# Patient Record
Sex: Female | Born: 1990 | Race: White | Hispanic: No | Marital: Married | Smoking: Former smoker
Health system: Southern US, Community
[De-identification: ages and names within clinical notes are randomized; demographics above are authoritative.]

## PROBLEM LIST (undated history)

## (undated) DIAGNOSIS — F32A Depression, unspecified: Secondary | ICD-10-CM

## (undated) DIAGNOSIS — J45909 Unspecified asthma, uncomplicated: Secondary | ICD-10-CM

## (undated) DIAGNOSIS — F909 Attention-deficit hyperactivity disorder, unspecified type: Secondary | ICD-10-CM

## (undated) DIAGNOSIS — F419 Anxiety disorder, unspecified: Secondary | ICD-10-CM

## (undated) DIAGNOSIS — I10 Essential (primary) hypertension: Secondary | ICD-10-CM

## (undated) HISTORY — DX: Depression, unspecified: F32.A

## (undated) HISTORY — PX: ELBOW FRACTURE SURGERY: SHX616

## (undated) HISTORY — DX: Essential (primary) hypertension: I10

## (undated) HISTORY — DX: Attention-deficit hyperactivity disorder, unspecified type: F90.9

## (undated) HISTORY — DX: Unspecified asthma, uncomplicated: J45.909

## (undated) HISTORY — DX: Anxiety disorder, unspecified: F41.9

## (undated) HISTORY — PX: TOOTH EXTRACTION: SUR596

---
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
## 2008-02-01 ENCOUNTER — Other Ambulatory Visit: Admission: RE | Admit: 2008-02-01 | Discharge: 2008-02-01 | Payer: Self-pay | Admitting: Internal Medicine

## 2018-08-22 DIAGNOSIS — J4520 Mild intermittent asthma, uncomplicated: Secondary | ICD-10-CM | POA: Insufficient documentation

## 2018-11-11 DIAGNOSIS — F4010 Social phobia, unspecified: Secondary | ICD-10-CM | POA: Insufficient documentation

## 2018-11-11 DIAGNOSIS — F900 Attention-deficit hyperactivity disorder, predominantly inattentive type: Secondary | ICD-10-CM | POA: Insufficient documentation

## 2018-11-11 DIAGNOSIS — F330 Major depressive disorder, recurrent, mild: Secondary | ICD-10-CM | POA: Insufficient documentation

## 2018-11-11 HISTORY — DX: Social phobia, unspecified: F40.10

## 2018-11-11 HISTORY — DX: Major depressive disorder, recurrent, mild: F33.0

## 2020-06-29 NOTE — L&D Delivery Note (Addendum)
OB/GYN Faculty Practice Delivery Note  Kathy Davenport is a 30 y.o. G1P0 at [redacted]w[redacted]d admitted for IOL for gHTN.   GBS Status: Negative/-- (08/08 0341) Maximum Maternal Temperature: 98.2  Labor course: Initial SVE: 1.5/50/posterior. Augmentation with Pitocin and AROM. She then progressed to complete.  ROM: 9h 58m with clear fluid  Birth: At 2149 a viable female was delivered via spontaneous vaginal delivery (Presentation: LOA). Nuchal cord present: Yes.  Shoulders and body delivered in usual fashion. Infant placed directly on mom's abdomen for bonding/skin-to-skin, baby dried and stimulated. Cord clamped x 2 after 1 minute and cut by FOB, Sharlet [place]ina.  Cord blood collected.  The placenta separated spontaneously and delivered via gentle cord traction.  Pitocin infused rapidly IV per protocol.  Brisk bleeding noted after delivery of placenta. Fundus firm with massage. Bleeding slowed down immediately. Placenta inspected and appears to be intact with a 3 VC.  Placenta/Cord with the following complications: none.  Cord pH: n/a Sponge and instrument count were correct x2.  Intrapartum complications:  gHTN Anesthesia:  epidural Episiotomy: none Lacerations:  1st degree and vaginal Suture Repair: 2.0 vicryl EBL (mL): 400>>additional 70 mL during first PP fundal rub   Infant: APGAR (1 MIN):  8 APGAR (10 MINS): 9 Infant weight: 7 lbs 5.3 oz (3325 g)  Mom to postpartum.  Baby to Couplet care / Skin to Skin. Placenta to L&D   Plans to Breast and bottlefeed Contraception: oral progesterone-only contraceptive Circumcision: wants inpatient  Note sent to [facility]: [facility] for pp visit.  Raelyn Mora , MSN, CNM 02/14/2021 10:12 PM

## 2020-07-10 ENCOUNTER — Ambulatory Visit (INDEPENDENT_AMBULATORY_CARE_PROVIDER_SITE_OTHER): Payer: Self-pay

## 2020-07-10 ENCOUNTER — Other Ambulatory Visit: Payer: Self-pay

## 2020-07-10 VITALS — Ht 65.0 in

## 2020-07-10 DIAGNOSIS — Z3400 Encounter for supervision of normal first pregnancy, unspecified trimester: Secondary | ICD-10-CM | POA: Insufficient documentation

## 2020-07-10 DIAGNOSIS — Z3200 Encounter for pregnancy test, result unknown: Secondary | ICD-10-CM

## 2020-07-10 LAB — POCT URINE PREGNANCY: Preg Test, Ur: POSITIVE — AB

## 2020-07-10 MED ORDER — BLOOD PRESSURE KIT DEVI: 1.0000 | 0 refills | Status: DC | PRN

## 2020-07-10 NOTE — Progress Notes (Signed)
Patient was assessed and managed by nursing staff during this encounter. I have reviewed the chart and agree with the documentation and plan. I have also made any necessary editorial changes.  Warden Fillers, MD 07/10/2020 12:24 PM

## 2020-07-10 NOTE — Progress Notes (Addendum)
..    In-Person  Location:[facility] Patient: Kathy Davenport Provider: Nurse   I discussed the limitations, risks, security and privacy concerns of performing an evaluation and management service by telephone and the availability of in person appointments. I also discussed with the patient that there may be a patient responsible charge related to this service. The patient expressed understanding and agreed to proceed.   History of Present Illness: PRENATAL INTAKE SUMMARY  Kathy Davenport presents today New OB Nurse Interview.  OB History    Gravida  1   Para      Term      Preterm      AB      Living        SAB      IAB      Ectopic      Multiple      Live Births             I have reviewed the patient's medical, obstetrical, social, and family histories, medications, and available lab results.  SUBJECTIVE Patient reports hx of anxiety and depression, currenty seeing a therapist. Denies suicidal ideations Pt and FOB report being happy about the pregnancy   Observations/Objective: Initial nurse interview for history/labs (New OB) Pt is in the office with FOB  EDD: 02-21-2021 GA: [redacted]w[redacted]d GP:G1P0  GENERAL APPEARANCE: alert, well appearing  Assessment and Plan: Normal pregnancy BP [facility] Babyscripts sent to email Labs to be completed at next visit with Dr. Earlene Plater on 08-07-2020. PhQ-9= 16   Follow Up Instructions:   I discussed the assessment and treatment plan with the patient. The patient was provided an opportunity to ask questions and all were answered. The patient agreed with the plan and demonstrated an understanding of the instructions.   The patient was advised to call back or seek an in-person evaluation if the symptoms worsen or if the condition fails to improve as anticipated.  I provided 15 minutes of face-to-face time during this encounter.   Katrina Stack, RN

## 2020-07-26 ENCOUNTER — Other Ambulatory Visit: Payer: Self-pay

## 2020-07-26 ENCOUNTER — Ambulatory Visit (INDEPENDENT_AMBULATORY_CARE_PROVIDER_SITE_OTHER): Payer: 59

## 2020-07-26 DIAGNOSIS — O3680X0 Pregnancy with inconclusive fetal viability, not applicable or unspecified: Secondary | ICD-10-CM

## 2020-07-26 DIAGNOSIS — Z789 Other specified health status: Secondary | ICD-10-CM

## 2020-07-26 NOTE — Progress Notes (Signed)
DATING AND VIABILITY SONOGRAM   Kathy Davenport is a 30 y.o. year old G1P0 with LMP Patient's last menstrual period was 05/17/2020. which would correlate to  [redacted]w[redacted]d weeks gestation.  She has regular menstrual cycles.   She is here today for a confirmatory initial sonogram.    GESTATION: SINGLETON pregnancy     FETAL ACTIVITY:          Heart rate         171          The fetus is active.  Gestational criteria: Estimated Date of Delivery: 02/21/21 by LMP now at [redacted]w[redacted]d  Previous Scans:0                     CROWN RUMP LENGTH                   10 weeks                                                                               AVERAGE EGA(BY THIS SCAN):  10 weeks  WORKING EDD( LMP ):  02/23/21     TECHNICIAN COMMENTS:  Single live IUP at [redacted]w[redacted]d by CRL. FHR 171   A copy of this report including all images has been saved and backed up to a second source for retrieval if needed. All measures and details of the anatomical scan, placentation, fluid volume and pelvic anatomy are contained in that report.  Celestia Khat Leonell Lobdell 07/26/2020 8:18 AM

## 2020-07-26 NOTE — Progress Notes (Signed)
Patient was assessed and managed by nursing staff during this encounter. I have reviewed the chart and agree with the documentation and plan. I have also made any necessary editorial changes.  Warden Fillers, MD 07/26/2020 10:31 AM

## 2020-08-07 ENCOUNTER — Ambulatory Visit (INDEPENDENT_AMBULATORY_CARE_PROVIDER_SITE_OTHER): Payer: 59 | Admitting: Obstetrics and Gynecology

## 2020-08-07 ENCOUNTER — Other Ambulatory Visit: Payer: Self-pay

## 2020-08-07 ENCOUNTER — Encounter: Payer: Self-pay | Admitting: Obstetrics and Gynecology

## 2020-08-07 ENCOUNTER — Other Ambulatory Visit (HOSPITAL_COMMUNITY)
Admission: RE | Admit: 2020-08-07 | Discharge: 2020-08-07 | Disposition: A | Discharge status: Home / Self Care | Payer: 59 | Source: Ambulatory Visit | Attending: Obstetrics and Gynecology | Admitting: Obstetrics and Gynecology

## 2020-08-07 VITALS — BP 117/78 | HR 87 | Wt 192.8 lb

## 2020-08-07 DIAGNOSIS — Z3400 Encounter for supervision of normal first pregnancy, unspecified trimester: Secondary | ICD-10-CM | POA: Diagnosis present

## 2020-08-07 DIAGNOSIS — Z3A11 11 weeks gestation of pregnancy: Secondary | ICD-10-CM

## 2020-08-07 DIAGNOSIS — Z6830 Body mass index (BMI) 30.0-30.9, adult: Secondary | ICD-10-CM

## 2020-08-07 DIAGNOSIS — Z23 Encounter for immunization: Secondary | ICD-10-CM

## 2020-08-07 DIAGNOSIS — F419 Anxiety disorder, unspecified: Secondary | ICD-10-CM

## 2020-08-07 DIAGNOSIS — F32A Depression, unspecified: Secondary | ICD-10-CM | POA: Insufficient documentation

## 2020-08-07 DIAGNOSIS — F339 Major depressive disorder, recurrent, unspecified: Secondary | ICD-10-CM

## 2020-08-07 DIAGNOSIS — F909 Attention-deficit hyperactivity disorder, unspecified type: Secondary | ICD-10-CM

## 2020-08-07 HISTORY — DX: Depression, unspecified: F32.A

## 2020-08-07 MED ORDER — ASPIRIN EC 81 MG PO TBEC: 81.0000 mg | DELAYED_RELEASE_TABLET | Freq: Every day | ORAL | 2 refills | Status: DC

## 2020-08-07 NOTE — Progress Notes (Signed)
INITIAL PRENATAL VISIT NOTE  Subjective:  Kathy Davenport is a 30 y.o. G1P0 at 5w5dby LMP c/w 10 weeks [place] seen today for her initial prenatal visit. This is an unplanned pregnancy. She and partner are happy with the pregnancy. She was using nothing for birth control previously. She has an obstetric history significant for n/a. She has a medical history significant for asthma, depression/anxiety.  Was on adderrall until she found out she was pregnant, then stopped adderall. Tapered ativan when she found out she was pregnant and no longer on it as of 2 days ago. Still on lexapro and doing well with it.   Patient reports fatigue and nausea.  Contractions: Not present. Vag. Bleeding: None.   . Denies leaking of fluid.   Past Medical History:  Diagnosis Date  . ADHD (attention deficit hyperactivity disorder)   . Anxiety   . Asthma   . Depression    Past Surgical History:  Procedure Laterality Date  . ELBOW FRACTURE SURGERY     OB History  Gravida Para Term Preterm AB Living  1            SAB IAB Ectopic Multiple Live Births               # Outcome Date GA Lbr Len/2nd Weight Sex Delivery Anes PTL Lv  1 Current            Social History   Socioeconomic History  . Marital status: Single    Spouse name: Not on file  . Number of children: Not on file  . Years of education: Not on file  . Highest education level: Not on file  Occupational History  . Not on file  Tobacco Use  . Smoking status: Current Every Day Smoker    Types: E-cigarettes  . Smokeless tobacco: Never Used  . Tobacco comment: vape  Vaping Use  . Vaping Use: Every day  Substance and Sexual Activity  . Alcohol use: Not Currently  . Drug use: Never  . Sexual activity: Yes  Other Topics Concern  . Not on file  Social History Narrative  . Not on file   Social Determinants of Health   Financial Resource Strain: Not on file  Food Insecurity: Not on file  Transportation Needs: Not on file  Physical  Activity: Not on file  Stress: Not on file  Social Connections: Not on file    Family History  Problem Relation Age of Onset  . Hypertension Mother      Current Outpatient Medications:  .  albuterol (PROVENTIL) (2.5 MG/3ML) 0.083% nebulizer solution, Take 2.5 mg by nebulization every 6 (six) hours as needed for wheezing or shortness of breath., Disp: , Rfl:  .  aspirin EC 81 MG tablet, Take 1 tablet (81 mg total) by mouth daily. Take after 12 weeks for prevention of preeclampsia later in pregnancy, Disp: 300 tablet, Rfl: 2 .  escitalopram (LEXAPRO) 20 MG tablet, Take 20 mg by mouth daily., Disp: , Rfl:  .  Blood Pressure Monitoring (BLOOD PRESSURE KIT) DEVI, 1 kit by Does not apply route as needed. Large (Patient not taking: Reported on 08/07/2020), Disp: 1 each, Rfl: 0  Allergies  Allergen Reactions  . Penicillins Hives   Review of Systems: Negative except for what is mentioned in HPI.  Objective:   Vitals:   08/07/20 0840  BP: 117/78  Pulse: 87  Weight: 192 lb 12.8 oz (87.5 kg)    Fetal Status: Fetal Heart  Rate (bpm): 164         Physical Exam: BP 117/78   Pulse 87   Wt 192 lb 12.8 oz (87.5 kg)   LMP 05/17/2020   BMI 32.08 kg/m  CONSTITUTIONAL: Well-developed, well-nourished female in no acute distress.  NEUROLOGIC: Alert and oriented to person, place, and time. Normal reflexes, muscle tone coordination. No cranial nerve deficit noted. PSYCHIATRIC: Normal mood and affect. Normal behavior. Normal judgment and thought content. SKIN: Skin is warm and dry. No rash noted. Not diaphoretic. No erythema. No pallor. HENT:  Normocephalic, atraumatic, External right and left ear normal. Oropharynx is clear and moist EYES: Conjunctivae and EOM are normal. Pupils are equal, round, and reactive to light. No scleral icterus.  NECK: Normal range of motion, supple, no masses CARDIOVASCULAR: Normal heart rate noted, regular rhythm RESPIRATORY: Effort and breath sounds normal, no  problems with respiration noted BREASTS: symmetric, non-tender, no masses palpable ABDOMEN: Soft, nontender, nondistended, gravid. GU: normal appearing external female genitalia, nulliparous normal appearing cervix, scant white discharge in vagina, no lesions noted Bimanual: 10 weeks sized uterus, no adnexal tenderness or palpable lesions noted MUSCULOSKELETAL: Normal range of motion. EXT:  No edema and no tenderness. 2+ distal pulses.   Assessment and Plan:  Pregnancy: G1P0 at 65w5dby LMP  1. [redacted] weeks gestation of pregnancy  2. Supervision of normal first pregnancy, antepartum - [place] [employer], multiple providers, fellows, medical students, virtual visits, MyChart.  - to get genetic counseling - Enroll Patient in Babyscripts - Babyscripts Schedule Optimization - CBC/D/Plt+RPR+Rh+ABO+Rub Ab... - Cytology - PAP( [place]) - Cervicovaginal ancillary only( [place]) - Culture, OB Urine - [place]FM OB DETAIL +14 WK; Future - Genetic Screening - HgB A1c - UTD on COVID vaccine, recommend booster  3. BMI 30.0-30.9,adult Start baby ASA 12 weeks - TSH   4. Recurrent major depressive disorder, remission status unspecified (HFertile Doing well on lexapro for now Cont lexapro, reassess as needed  5. Attention deficit hyperactivity disorder (ADHD), unspecified ADHD type Stopped adderall when found out she was pregnant  6. Anxiety Tapered off ativan when found out she was pregnant    Preterm labor symptoms and general obstetric precautions including but not limited to vaginal bleeding, contractions, leaking of fluid and fetal movement were reviewed in detail with the patient.  Please refer to After Visit Summary for other counseling recommendations.   Return in about 4 weeks (around 09/04/2020) for low OB, in person.  KSloan Leiter2/02/2021 10:09 AM

## 2020-08-07 NOTE — Progress Notes (Signed)
NOB in office, intake completed on 01/12//22 and u/s on 07/26/20. Pt is with FOB today, reports no complaints.

## 2020-08-08 LAB — CBC/D/PLT+RPR+RH+ABO+RUB AB...
Antibody Screen: NEGATIVE
Basophils Absolute: 0 10*3/uL (ref 0.0–0.2)
Basos: 1 %
EOS (ABSOLUTE): 0.5 10*3/uL — ABNORMAL HIGH (ref 0.0–0.4)
Eos: 6 %
HCV Ab: <0.1 s/co ratio (ref 0.0–0.9)
HIV Screen 4th Generation wRfx: NONREACTIVE
Hematocrit: 38.8 % (ref 34.0–46.6)
Hemoglobin: 13 g/dL (ref 11.1–15.9)
Hepatitis B Surface Ag: NEGATIVE
Immature Grans (Abs): 0 10*3/uL (ref 0.0–0.1)
Immature Granulocytes: 0 %
Lymphocytes Absolute: 2.5 10*3/uL (ref 0.7–3.1)
Lymphs: 30 %
MCH: 32.7 pg (ref 26.6–33.0)
MCHC: 33.5 g/dL (ref 31.5–35.7)
MCV: 98 fL — ABNORMAL HIGH (ref 79–97)
Monocytes Absolute: 0.4 10*3/uL (ref 0.1–0.9)
Monocytes: 5 %
Neutrophils Absolute: 5.1 10*3/uL (ref 1.4–7.0)
Neutrophils: 58 %
Platelets: 271 10*3/uL (ref 150–450)
RBC: 3.97 x10E6/uL (ref 3.77–5.28)
RDW: 12.4 % (ref 11.7–15.4)
RPR Ser Ql: NONREACTIVE
Rh Factor: POSITIVE
Rubella Antibodies, IGG: 3.71 index (ref >0.99)
WBC: 8.6 10*3/uL (ref 3.4–10.8)

## 2020-08-08 LAB — CERVICOVAGINAL ANCILLARY ONLY
Bacterial Vaginitis (gardnerella): NEGATIVE
Candida Glabrata: NEGATIVE
Candida Vaginitis: NEGATIVE
Chlamydia: NEGATIVE
Comment: NEGATIVE
Comment: NEGATIVE
Comment: NEGATIVE
Comment: NEGATIVE
Comment: NEGATIVE
Comment: NORMAL
Neisseria Gonorrhea: NEGATIVE
Trichomonas: NEGATIVE

## 2020-08-08 LAB — HEMOGLOBIN A1C
Est. average glucose Bld gHb Est-mCnc: 111 mg/dL
Hgb A1c MFr Bld: 5.5 % (ref 4.8–5.6)

## 2020-08-08 LAB — TSH: TSH: 2.53 u[IU]/mL (ref 0.450–4.500)

## 2020-08-08 LAB — HCV INTERPRETATION

## 2020-08-09 LAB — CULTURE, OB URINE

## 2020-08-09 LAB — URINE CULTURE, OB REFLEX

## 2020-08-13 LAB — CYTOLOGY - PAP
Comment: NEGATIVE
Diagnosis: NEGATIVE
Diagnosis: REACTIVE
High risk HPV: NEGATIVE

## 2020-08-15 ENCOUNTER — Encounter: Payer: Self-pay | Admitting: Obstetrics and Gynecology

## 2020-08-20 ENCOUNTER — Encounter: Payer: Self-pay | Admitting: Obstetrics and Gynecology

## 2020-08-27 DIAGNOSIS — Z419 Encounter for procedure for purposes other than remedying health state, unspecified: Secondary | ICD-10-CM | POA: Diagnosis not present

## 2020-09-04 ENCOUNTER — Encounter: Payer: Self-pay | Admitting: Obstetrics and Gynecology

## 2020-09-04 ENCOUNTER — Ambulatory Visit (INDEPENDENT_AMBULATORY_CARE_PROVIDER_SITE_OTHER): Payer: 59 | Admitting: Obstetrics and Gynecology

## 2020-09-04 ENCOUNTER — Other Ambulatory Visit: Payer: Self-pay

## 2020-09-04 VITALS — BP 129/82 | HR 87 | Wt 193.2 lb

## 2020-09-04 DIAGNOSIS — F339 Major depressive disorder, recurrent, unspecified: Secondary | ICD-10-CM

## 2020-09-04 DIAGNOSIS — F419 Anxiety disorder, unspecified: Secondary | ICD-10-CM

## 2020-09-04 DIAGNOSIS — Z3400 Encounter for supervision of normal first pregnancy, unspecified trimester: Secondary | ICD-10-CM

## 2020-09-04 DIAGNOSIS — Z6830 Body mass index (BMI) 30.0-30.9, adult: Secondary | ICD-10-CM

## 2020-09-04 DIAGNOSIS — Z3A15 15 weeks gestation of pregnancy: Secondary | ICD-10-CM

## 2020-09-04 DIAGNOSIS — O99612 Diseases of the digestive system complicating pregnancy, second trimester: Secondary | ICD-10-CM

## 2020-09-04 DIAGNOSIS — K5900 Constipation, unspecified: Secondary | ICD-10-CM

## 2020-09-04 NOTE — Patient Instructions (Addendum)
Wonderful to meet you today! -You may take melatonin 3mg  nightly prior to bedtime as needed for insomnia. -You may take miralax 1 capful daily as needed for constipation. Be sure to drink plenty of water and eat lots of fiber to prevent constipation. -We will see you in 4 weeks!  .html">  First Trimester of Pregnancy  The first trimester of pregnancy starts on the first day of your last menstrual period until the end of week 12. This is also called months 1 through 3 of pregnancy. Body changes during your first trimester Your body goes through many changes during pregnancy. The changes usually return to normal after your baby is born. Physical changes  You may gain or lose weight.  Your breasts may grow larger and hurt. The area around your nipples may get darker.  Dark spots or blotches may develop on your face.  You may have changes in your hair. Health changes  You may feel like you might vomit (nauseous), and you may vomit.  You may have heartburn.  You may have headaches.  You may have trouble pooping (constipation).  Your gums may bleed. Other changes  You may get tired easily.  You may pee (urinate) more often.  Your menstrual periods will stop.  You may not feel hungry.  You may want to eat certain kinds of food.  You may have changes in your emotions from day to day.  You may have more dreams. Follow these instructions at home: Medicines  Take over-the-counter and prescription medicines only as told by your doctor. Some medicines are not safe during pregnancy.  Take a prenatal vitamin that contains at least 600 micrograms (mcg) of folic acid. Eating and drinking  Eat healthy meals that include: ? Fresh fruits and vegetables. ? Whole grains. ? Good sources of protein, such as meat, eggs, or tofu. ? Low-fat dairy products.  Avoid raw meat and unpasteurized juice, milk, and cheese.  If you feel like you  may vomit, or you vomit: ? Eat 4 or 5 small meals a day instead of 3 large meals. ? Try eating a few soda crackers. ? Drink liquids between meals instead of during meals.  You may need to take these actions to prevent or treat trouble pooping: ? Drink enough fluids to keep your pee (urine) pale yellow. ? Eat foods that are high in fiber. These include beans, whole grains, and fresh fruits and vegetables. ? Limit foods that are high in fat and sugar. These include fried or sweet foods. Activity  Exercise only as told by your doctor. Most people can do their usual exercise routine during pregnancy.  Stop exercising if you have cramps or pain in your lower belly (abdomen) or low back.  Do not exercise if it is too hot or too humid, or if you are in a place of great height (high altitude).  Avoid heavy lifting.  If you choose to, you may have sex unless your doctor tells you not to. Relieving pain and discomfort  Wear a good support bra if your breasts are sore.  Rest with your legs raised (elevated) if you have leg cramps or low back pain.  If you have bulging veins (varicose veins) in your legs: ? Wear support hose as told by your doctor. ? Raise your feet for 15 minutes, 3-4 times a day. ? Limit salt in your food. Safety  Wear your seat belt at all times when you are in a car.  Talk with  your doctor if someone is hurting you or yelling at you.  Talk with your doctor if you are feeling sad or have thoughts of hurting yourself. Lifestyle  Do not use hot tubs, steam rooms, or saunas.  Do not douche. Do not use tampons or scented sanitary pads.  Do not use herbal medicines, illegal drugs, or medicines that are not approved by your doctor. Do not drink alcohol.  Do not smoke or use any products that contain nicotine or tobacco. If you need help quitting, ask your doctor.  Avoid cat litter boxes and soil that is used by cats. These carry germs that can cause harm to the baby  and can cause a loss of your baby by miscarriage or stillbirth. General instructions  Keep all follow-up visits. This is important.  Ask for help if you need counseling or if you need help with nutrition. Your doctor can give you advice or tell you where to go for help.  Visit your dentist. At home, brush your teeth with a soft toothbrush. Floss gently.  Write down your questions. Take them to your prenatal visits. Where to find more information  American Pregnancy Association: americanpregnancy.org  [employer]st[employer]ians [employer]necologists: www.acog.org  Office on Women's Health: MightyReward.co.nz Contact a doctor if:  You are dizzy.  You have a fever.  You have mild cramps or pressure in your lower belly.  You have a nagging pain in your belly area.  You continue to feel like you may vomit, you vomit, or you have watery poop (diarrhea) for 24 hours or longer.  You have a bad-smelling fluid coming from your vagina.  You have pain when you pee.  You are exposed to a disease that spreads from person to person, such as chickenpox, measles, Zika virus, HIV, or hepatitis. Get help right away if:  You have spotting or bleeding from your vagina.  You have very bad belly cramping or pain.  You have shortness of breath or chest pain.  You have any kind of injury, such as from a fall or a car crash.  You have new or increased pain, swelling, or redness in an arm or leg. Summary  The first trimester of pregnancy starts on the first day of your last menstrual period until the end of week 12 (months 1 through 3).  Eat 4 or 5 small meals a day instead of 3 large meals.  Do not smoke or use any products that contain nicotine or tobacco. If you need help quitting, ask your doctor.  Keep all follow-up visits. This information is not intended to replace advice given to you by your health care provider. Make sure you discuss any questions you have with your  health care provider. Document Revised: 11/22/2019 Document Reviewed: 09/28/2019 Elsevier Patient Education  2021 [employer].

## 2020-09-04 NOTE — Progress Notes (Signed)
   PRENATAL VISIT NOTE  Subjective:  Kathy Davenport is a 30 y.o. G1P0 at [redacted]w[redacted]d being seen today for ongoing prenatal care.  She is currently monitored for the following issues for this low-risk pregnancy and has Supervision of normal first pregnancy, antepartum; BMI 30.0-30.9,adult; Depression; ADHD (attention deficit hyperactivity disorder); Anxiety; [redacted] weeks gestation of pregnancy; and Constipation during pregnancy in second trimester on their problem list.  Patient reports constipation and pain with defecation. One episode of blood on toilet tissue s/p bowel movement in setting of ASA use. Denies leaking of fluid.   The following portions of the patient's history were reviewed and updated as appropriate: allergies, current medications, past family history, past medical history, past social history, past surgical history and problem list.   Objective:   Vitals:   09/04/20 0811  BP: 129/82  Pulse: 87  Weight: 193 lb 3.2 oz (87.6 kg)    Fetal Status: Fetal Heart Rate (bpm): 144   Movement: Absent     General:  Alert, oriented and cooperative. Patient is in no acute distress.  Skin: Skin is warm and dry. No rash noted.   Cardiovascular: Normal heart rate noted  Respiratory: Normal respiratory effort, no problems with respiration noted  Abdomen: Soft, gravid, appropriate for gestational age.  Pain/Pressure: Absent     Pelvic: Cervical exam deferred        Extremities: Normal range of motion.  Edema: None  Mental Status: Normal mood and affect. Normal behavior. Normal judgment and thought content.   Assessment and Plan:  Pregnancy: G1P0 at [redacted]w[redacted]d 1. [redacted] weeks gestation of pregnancy, Supervision of normal first pregnancy, antepartum: No red flag symptoms. +FHTs today. Normal BP. S/p flu and covid vaccines. Low risk female. Desire for circumcision. Plan for breast & bottle feeding. Considering partner vasectomy. - counseled on bridge options for birth control today; pt considering POPs -  continue prenatal vitamin + ASA 81 mg daily - anatomy scan scheduled 4/11 - rtc in 4 weeks or sooner for return precautions as noted  2. Anxiety  ADHD: Stopped adderall, ativan with news of pregnancy. - continue weekly therapy appts - continued on lexapro 20mg  daily with good benefit  3. Recurrent major depressive disorder, remission status unspecified (HCC): No safety concerns today; supportive partner present at visit. - weekly therapy, lexapro as noted above  4. BMI 30.0-30.9,adult: One pound of weight gain since last appt. Pt walking regularly with her partner. - encouraged continued lifestyle modifications with diet and regular physical activity  5. Constipation: Exacerbation of chronic issue. Pain with defecation. Several days w/o BM at times. - counseled on increased water and fiber intake - recommended miralax daily prn  Preterm labor symptoms and general obstetric precautions including but not limited to vaginal bleeding, contractions, leaking of fluid and fetal movement were reviewed in detail with the patient. Please refer to After Visit Summary for other counseling recommendations.   Return in about 4 weeks (around 10/02/2020) for f/u prenatal visit, in person, any provider.  Future Appointments  Date Time Provider Department Center  10/02/2020  8:15 AM [place], 12/02/2020, NP [facility] None  10/07/2020 10:00 AM [facility] NURSE [facility]  10/07/2020 10:15 AM [facility]   Susie Ehresman, 12/07/2020, MD OB Fellow, Faculty Practice 09/04/2020 1:09 PM

## 2020-09-27 DIAGNOSIS — Z419 Encounter for procedure for purposes other than remedying health state, unspecified: Secondary | ICD-10-CM | POA: Diagnosis not present

## 2020-10-02 ENCOUNTER — Other Ambulatory Visit: Payer: Self-pay

## 2020-10-02 ENCOUNTER — Ambulatory Visit (INDEPENDENT_AMBULATORY_CARE_PROVIDER_SITE_OTHER): Payer: 59 | Admitting: Women's Health

## 2020-10-02 VITALS — BP 104/67 | HR 83 | Wt 199.0 lb

## 2020-10-02 DIAGNOSIS — Z3A19 19 weeks gestation of pregnancy: Secondary | ICD-10-CM

## 2020-10-02 DIAGNOSIS — F339 Major depressive disorder, recurrent, unspecified: Secondary | ICD-10-CM

## 2020-10-02 DIAGNOSIS — F909 Attention-deficit hyperactivity disorder, unspecified type: Secondary | ICD-10-CM

## 2020-10-02 DIAGNOSIS — Z3400 Encounter for supervision of normal first pregnancy, unspecified trimester: Secondary | ICD-10-CM

## 2020-10-02 DIAGNOSIS — F419 Anxiety disorder, unspecified: Secondary | ICD-10-CM

## 2020-10-02 NOTE — Progress Notes (Signed)
+   Fetal movement. No complaints.  

## 2020-10-02 NOTE — Progress Notes (Addendum)
Subjective:  Kathy Davenport is a 30 y.o. G1P0 at [redacted]w[redacted]d being seen today for ongoing prenatal care.  She is currently monitored for the following issues for this low-risk pregnancy and has Supervision of normal first pregnancy, antepartum; BMI 30.0-30.9,adult; Depression; ADHD (attention deficit hyperactivity disorder); Anxiety; and Constipation during pregnancy in second trimester on their problem list.  Patient reports no complaints.  Contractions: Not present. Vag. Bleeding: None.  Movement: Present. Denies leaking of fluid.   The following portions of the patient's history were reviewed and updated as appropriate: allergies, current medications, past family history, past medical history, past social history, past surgical history and problem list. Problem list updated.  Objective:   Vitals:   10/02/20 0832  BP: 104/67  Pulse: 83  Weight: 199 lb (90.3 kg)    Fetal Status: Fetal Heart Rate (bpm): 150   Movement: Present     General:  Alert, oriented and cooperative. Patient is in no acute distress.  Skin: Skin is warm and dry. No rash noted.   Cardiovascular: Normal heart rate noted  Respiratory: Normal respiratory effort, no problems with respiration noted  Abdomen: Soft, gravid, appropriate for gestational age. Pain/Pressure: Absent     Pelvic: Vag. Bleeding: None     Cervical exam deferred        Extremities: Normal range of motion.  Edema: Trace  Mental Status: Normal mood and affect. Normal behavior. Normal judgment and thought content.   Urinalysis:      Assessment and Plan:  Pregnancy: G1P0 at [redacted]w[redacted]d  1. Supervision of normal first pregnancy, antepartum -peds list given -CBE info given - AFP, Serum, Open Spina Bifida  2. Anxiety -pt reports difficulties with anxiety at times -pt also reports difficulties staying asleep, but is unsure if this is related to physical discomfort or anxiety -pt advised to keep sleep journal and to discuss with [facility] as treatment will depend  on cause, but did discuss good sleep hygeine, info given -referral made for [facility]  3. Attention deficit hyperactivity disorder (ADHD), unspecified ADHD type -no concerns  4. Recurrent major depressive disorder, remission status unspecified (HCC) -on Lexapro -discussed pp depression/worsening depression during pregnancy -pt currently has therapist but not psychiatrist d/t changing health insurance -pt does not have PCP, does not have anyone to manage medications -discussed [facility] hospital and [facility] if SI/HI, pt reports this is not currently an issue and has never been an issue for her in the past, but verbalizes appreciation for the information -discussed normalcy of increasing anxiety/depression during pregnancy and ppartum and encouraged regular care and stabilization of mood prior to delivery  5. [redacted] weeks gestation of pregnancy  Partner present for entire visit.  Preterm labor symptoms and general obstetric precautions including but not limited to vaginal bleeding, contractions, leaking of fluid and fetal movement were reviewed in detail with the patient. I discussed the assessment and treatment plan with the patient. The patient was provided an opportunity to ask questions and all were answered. The patient agreed with the plan and demonstrated an understanding of the instructions. The patient was advised to call back or seek an in-person office evaluation/go to MAU at [facility] for any urgent or concerning symptoms. Please refer to After Visit Summary for other counseling recommendations.  Return in about 4 weeks (around 10/30/2020) for in-person LOB/APP OK, needs [facility] referral.   Esha Fincher, Odie Sera, NP

## 2020-10-02 NOTE — Patient Instructions (Addendum)
Maternity Assessment Unit (MAU)  The Maternity Assessment Unit (MAU) is located at the [facility]. The address is: [address] [place], [place], [place] [address]. Please see map below for additional directions.    The Maternity Assessment Unit is designed to help you during your pregnancy, and for up to 6 weeks after delivery, with any pregnancy- or postpartum-related emergencies, if you think you are in labor, or if your water has broken. For example, if you experience nausea and vomiting, vaginal bleeding, severe abdominal or pelvic pain, elevated blood pressure or other problems related to your pregnancy or postpartum time, please come to the Maternity Assessment Unit for assistance.       AREA PEDIATRIC/FAMILY PRACTICE PHYSICIANS  [facility] [address] [place], [place] [address] Phone - [phone]   Fax - [place] [address] [place], [place]  [address] Phone - [phone]   Fax - [phone]  [place] [address] [place] [address] [place], [place]  [address] Phone - [phone]   Fax - [phone]  [facility] [address] [place], [place]  [address] Phone - [phone]   Fax - [phone]  [place] [address], [place], [place]  [address] Phone - [phone]   Fax - [place] [address] [place], [place]  [address] Phone - [phone]   Fax - [place] [facility] [place] [address], [place], [place]  [address] Phone - [phone]   Fax - [phone]  [place] [address] [place], [place], [place]  [address] Phone - [phone]   Fax - [place] [address] [place], [place]  [address] Phone - [phone]   Fax -  [phone] [facility] [place]. [address] [place], [place]  [address] Phone - [phone]   Fax - [phone]  [place] [address] [place], [place]  [address] Phone - [phone]   Fax - [phone]  [facility] [place], [place], [place]  [address] Phone - [phone]   Fax - [place] [address], [place], [place]  [address] Phone - [phone]   Fax - [phone]  [facility] [address], [place], [place]  [address] Phone - [place], [place]  [address] Phone - [phone]   Fax - [place] [address], [place], [place]  [address] Phone - [phone]   Fax - [phone]  [place] [address] [place], [place]  [address] Phone - [phone]   Fax - [place], [place]  [address] Phone - [phone]   Fax - [place], [place], [place]  [address] Phone - [phone]   Fax - [place] [address], [address] [place], [place]  [address] Phone - [phone]   Fax - [phone]  [place] RUBIN [address] [place], [place]  [address] Phone - [phone]   Fax - [place] [address] [place],   [address] Phone - [phone]  Fax - [phone]  [facility] [address] [place], [place]  [address] Phone - [phone]   Fax - [phone] Gerarda Fraction [phone] [place], [place]  [address] Phone - [phone]   Fax - [phone]  [facility] [address] [place], [place]  [address] Phone - [phone]   Fax - [phone]  [facility] - [place] [address] [place], [place]  [address] Phone - [phone]   Fax - [phone]         Childbirth Education Options: [facility] Classes:  Childbirth education classes can help you get ready for a positive parenting experience. You can also meet other expectant parents and get free stuff for your baby. Each class runs for five weeks on the same night and costs $45 for the mother-to-be and her support person. Medicaid covers the cost if you are eligible. Call [phone] to register. [facility] Childbirth Education:  [phone] or [phone] or [email or web address]  Baby & Me Class: Discuss newborn & infant parenting and family adjustment issues with other new mothers in a relaxed environment. Each week brings a new speaker or baby-centered activity. We encourage new mothers to join [place] every Thursday at 11:00am. Babies birth until crawling. No registration or fee. Daddy [employer]: This course offers Dads-to-be the tools and knowledge needed to feel confident on their journey to becoming new fathers. Experienced dads, who have been trained as coaches, teach dads-to-be how to hold, comfort, diaper, swaddle and play with their infant while being able to support the new mom as well. A class for men taught by men. $25/dad Big Brother/Big Sister: Let your children share in the joy of a new brother or sister in this special class designed just for them. Class includes discussion about how families care for babies: swaddling, holding, diapering, safety as well as how they can be helpful in their new role. This class is designed for children ages 2 to 83, but any age is welcome. Please register each child individually. $5/child  Mom Talk: This mom-led group offers support and connection to mothers as they journey through the adjustments and struggles of that sometimes overwhelming first year after the birth of a  child. Tuesdays at 10:00am and Thursdays at 6:00pm. Babies welcome. No registration or fee. Breastfeeding Support Group: This group is a mother-to-mother support circle where moms have the opportunity to share their breastfeeding experiences. A Lactation Consultant is present for questions and concerns. Meets each Tuesday at 11:00am. No fee or registration. Breastfeeding Your Baby: Learn what to expect in the first days of breastfeeding your newborn.  This class will help you feel more confident with the skills needed to begin your breastfeeding experience. Many new mothers are concerned about breastfeeding after leaving the hospital. This class will also address the most common fears and challenges about breastfeeding during the first few weeks, months and beyond. (call for fee) Comfort Techniques and Tour: This 2 hour interactive class will provide you the opportunity to learn & practice hands-on techniques that can help relieve some of the discomfort of labor and encourage your baby to rotate toward the best position for birth. You and your partner will be able to try a variety of labor positions with birth balls and rebozos as well as practice breathing, relaxation, and visualization techniques. A tour of the [facility] is included with this class. $20 per registrant and support person Childbirth  Class- Weekend Option: This class is a Weekend version of our Birth & Baby series. It is designed for parents who have a difficult time fitting several weeks of classes into their schedule. It covers the care of your newborn and the basics of labor and childbirth. It also includes a Maternity Care Center Tour of [facility] and lunch. The class is held two consecutive days: beginning on Friday evening from 6:30 - 8:30 p.m. and the next day, Saturday from 9 a.m. - 4 p.m. (call for fee) Linden Dolin Class: Interested in a waterbirth?  This informational class will help you discover  whether waterbirth is the right fit for you. Education about waterbirth itself, supplies you would need and how to assemble your support team is what you can expect from this class. Some obstetrical practices require this class in order to pursue a waterbirth. (Not all obstetrical practices offer waterbirth-check with your healthcare provider.) Register only the expectant mom, but you are encouraged to bring your partner to class! Required if planning waterbirth, no fee. Infant/Child CPR: Parents, grandparents, babysitters, and friends learn Cardio-Pulmonary Resuscitation skills for infants and children. You will also learn how to treat both conscious and unconscious choking in infants and children. This Family & Friends program does not offer certification. Register each participant individually to ensure that enough mannequins are available. (Call for fee) Grandparent Love: Expecting a grandbaby? This class is for you! Learn about the latest infant care and safety recommendations and ways to support your own child as he or she transitions into the parenting role. Taught by Registered Nurses who are childbirth instructors, but most importantly...they are grandmothers too! $10/person. Childbirth Class- Natural Childbirth: This series of 5 weekly classes is for expectant parents who want to learn and practice natural methods of coping with the process of labor and childbirth. Relaxation, breathing, massage, visualization, role of the partner, and helpful positioning are highlighted. Participants learn how to be confident in their body's ability to give birth. This class will empower and help parents make informed decisions about their own care. Includes discussion that will help new parents transition into the immediate postpartum period. Maternity Care Center Tour of [facility] is included. We suggest taking this class between 25-32 weeks, but it's only a recommendation. $75 per registrant and one support  person or $30 Medicaid. Childbirth Class- 3 week Series: This option of 3 weekly classes helps you and your labor partner prepare for childbirth. Newborn care, labor & birth, cesarean birth, pain management, and comfort techniques are discussed and a Maternity Care Center Tour of [facility] is included. The class meets at the same time, on the same day of the week for 3 consecutive weeks beginning with the starting date you choose. $60 for registrant and one support person.  Marvelous Multiples: Expecting twins, triplets, or more? This class covers the differences in labor, birth, parenting, and breastfeeding issues that face multiples' parents. NICU tour is included. Led by a Certified Childbirth Educator who is the mother of twins. No fee. Caring for Baby: This class is for expectant and adoptive parents who want to learn and practice the most up-to-date newborn care for their babies. Focus is on birth through the first six weeks of life. Topics include feeding, bathing, diapering, crying, umbilical cord care, circumcision care and safe sleep. Parents learn to recognize symptoms of illness and when to call the pediatrician. Register only the mom-to-be and your partner or support person can plan to come with you! $10 per  registrant and support person Childbirth Class- online option: This online class offers you the freedom to complete a Birth and Baby series in the comfort of your own home. The flexibility of this option allows you to review sections at your own pace, at times convenient to you and your support people. It includes additional video information, animations, quizzes, and extended activities. Get organized with helpful eClass tools, checklists, and trackers. Once you register online for the class, you will receive an email within a few days to accept the invitation and begin the class when the time is right for you. The content will be available to you for 60 days. $60 for 60 days of online  access for you and your support people.               Alpha-Fetoprotein Test Why am I having this test? The alpha-fetoprotein test is a lab test most commonly used for pregnant women to help screen for birth defects in their unborn baby. It can be used to screen for chromosome (DNA) abnormalities, problems with the brain or spinal cord, or problems with the abdominal wall of the unborn baby (fetus). The alpha-fetoprotein test may also be done for men or nonpregnant women to check for certain cancers. What is being tested? This test measures the amount of alpha-fetoprotein (AFP) in your blood. AFP is a protein that is made by the liver. Levels can be detected in the mother's blood during pregnancy, starting at 10 weeks and peaking at 16-18 weeks of the pregnancy. Abnormal levels can sometimes be a sign of a birth defect in the baby. Certain cancers can cause a high level of AFP in men and nonpregnant women. What kind of sample is taken? A blood sample is required for this test. It is usually collected by inserting a needle into a blood vessel.   How are the results reported? Your test results will be reported as values. Your health care provider will compare your results to normal ranges that were established after testing a large group of people (reference values). Reference values may vary among labs and hospitals. For this test, common reference values are:  Adult: Less than 40 ng/mL or less than 40 mcg/L (SI units).  Child younger than 1 year: Less than 30 ng/mL. If you are pregnant, the values may also vary based on how long you have been pregnant. What do the results mean? Results that are above the reference values in pregnant women may indicate the following for the baby:  Neural tube defects, such as abnormalities of the spinal cord or brain.  Abdominal wall defects.  Multiple pregnancy such as twins.  Fetal distress or fetal death. Results that are above the  reference values in men or nonpregnant women may indicate:  Reproductive cancers, such as ovarian or testicular cancer.  Liver cancer.  Liver cell death.  Other types of cancer. Very low levels of AFP in pregnant women may indicate Down syndrome for the baby. Talk with your health care provider about what your results mean. Questions to ask your health care provider Ask your health care provider, or the department that is doing the test:  When will my results be ready?  How will I get my results?  What are my treatment options?  What other tests do I need?  What are my next steps? Summary  The alpha-fetoprotein test is done on pregnant women to help screen for birth defects in their unborn baby.  Certain cancers can  cause a high level of AFP in men and nonpregnant women.  For this test, a blood sample is usually collected by inserting a needle into a blood vessel.  Talk with your health care provider about what your results mean. This information is not intended to replace advice given to you by your health care provider. Make sure you discuss any questions you have with your health care provider. Document Revised: 01/05/2020 Document Reviewed: 01/05/2020 Elsevier Patient Education  2021 Elsevier Inc.        Perinatal Depression When a woman feels excessive sadness, anger, or anxiety during pregnancy or during the first 12 months after she gives birth, she has a condition called perinatal depression. This can interfere with work, school, relationships, and other everyday activities. If it is not managed properly, it can also interfere with the woman's ability to take care of the baby. Symptoms of perinatal depression may feel worse when living with a newborn. Sometimes, these symptoms are left untreated because they are thought to be normal mood swings during and right after pregnancy. However, if you have intense symptoms of depression that last for more than 2 weeks, it  is important to talk with your health care provider. This may be perinatal depression. What are the causes? The exact cause of this condition is not known. Hormonal changes during and after pregnancy may play a role in causing perinatal depression. What increases the risk? You are more likely to develop this condition if:  You have a personal or family history of depression, anxiety, or mood disorders.  You experience a stressful life event during pregnancy, such as the death of a loved one.  You have additional life stress, such as being a single parent.  You do not have support from family members or loved ones, or you are in an abusive relationship.  You have thyroid problems. What are the signs or symptoms? Symptoms of this condition include:  Emotional symptoms, such as: ? Feeling sad or hopeless. ? Feelings of guilt. ? Feeling irritable or overwhelmed.  Physical symptoms, such as: ? Changes with appetite or sleep. ? Lack of energy or motivation. ? Persistent headaches or stomach problems.  Behavioral symptoms, such as: ? Difficulty concentrating or completing tasks. ? Loss of interest in hobbies or relationships. How is this diagnosed? This condition is diagnosed based on a physical exam and mental evaluation.  In some cases, your health care provider may use a depression screening tool. This includes a list of questions that can help a health care provider diagnose depression.  You may be referred to a mental health expert who specializes in treating perinatal depression. How is this treated? This condition may be treated with:  Talk therapy with a mental health professional. This may be interpersonal psychotherapy, couples therapy, cognitive behavioral therapy, or mother-child bonding therapy.  Medicines. Your health care provider will discuss the safety of the medicines prescribed during pregnancy and breastfeeding.  Support groups.  Brain stimulation or light  therapies.  Stress reduction therapies, such as mindfulness.   Follow these instructions at home: Lifestyle  Do not use any products that contain nicotine or tobacco. These products include cigarettes, chewing tobacco, and vaping devices, such as e-cigarettes. If you need help quitting, ask your health care provider.  Do not drink alcohol when you are pregnant. It is also safest not to drink alcohol if you are breastfeeding.  After your baby is born, if you drink alcohol: ? Limit how much you have to  0-1 drink a day. ? Be aware of how much alcohol is in your drink. In the [place] one drink equals one 12 oz bottle of beer (355 mL), one 5 oz glass of wine (148 mL), or one 1 oz glass of hard liquor (44 mL).  Consider joining a support group for new mothers. Ask your health care provider for recommendations.  Take good care of yourself. Make sure you: ? Get as much sleep as possible. Talk with your partner about sharing the responsibility of getting up with your baby if possible and sharing child care responsibilities equally. Make sleep a priority. ? Eat a healthy diet. This includes plenty of fruits and vegetables, whole grains, and lean proteins. ? Exercise regularly, as told by your health care provider. Ask your health care provider what exercises are safe for you. Talk with your partner about making sure you both have opportunities to exercise. General instructions  Take over-the-counter and prescription medicines only as told by your health care provider.  Talk with your partner or family members about your feelings during pregnancy. Share any concerns, needs, or anxieties that you may have. Do not be afraid to ask for help. Find a mental health professional, if needed.  Ask for help with tasks or chores when you need it. Ask friends and family members to provide meals, watch your children, or help with cleaning.  Keep all follow-up visits. This is important. Contact a health care  provider if:  You or people close to you notice that you have symptoms of depression.  Your symptoms of depression get worse.  You take medicines and have side effects, such as nausea or sleep problems. Get help right away if:  You feel like hurting yourself, your baby, or someone else. If you feel like you may hurt yourself or others, or have thoughts about taking your own life, get help right away. You can go to your nearest emergency department or:  Call your local emergency services (911 in the [place]  Call a suicide crisis helpline, such as the National Suicide Prevention Lifeline, at 4061917446. This is open 24 hours a day in the [place]  Text the Crisis Text Line at 215-146-1166 (in the [place] Summary  Perinatal depression is when a woman feels excessive sadness, anger, or anxiety during pregnancy or during the first 12 months after she gives birth.  If perinatal depression is not managed properly, it can interfere with the woman's ability to take care of the baby.  This condition is treated with medicines, talk therapy, stress reduction therapies, or a combination of treatments.  Talk with your partner or family members about your feelings. Ask for help when you need it. This information is not intended to replace advice given to you by your health care provider. Make sure you discuss any questions you have with your health care provider. Document Revised: 12/08/2019 Document Reviewed: 12/08/2019 Elsevier Patient Education  2021 Elsevier Inc.        Perinatal Mood and Anxiety Disorder Perinatal mood and anxiety disorder (PMAD) is a mental health condition that happens when a person feels excessive sadness, anger, or worry and tension (anxiety) during pregnancy or during the first few months after the birth. This condition can last a few months or may continue for years if left untreated. PMAD may cause serious problems for the mother, her baby, or the father if not properly  managed. Depression and anxiety can interfere with the ability to take care of the baby. It  also may affect work, school, relationships, and other everyday activities. Having the baby blues is considered normal. Mild to moderate levels of sadness, exhaustion, and generally struggling with being a parent are considered the blues. Many parents experience these during the first 1-2 weeks after giving birth. If these symptoms become worse or last too long, it may be PMAD. What are the causes? The exact cause of this condition is not known. It may result from a combination of hormone changes and biological, social, and psychological factors. What increases the risk? The following factors may make you more likely to develop this condition:  Having a personal or family history of depression, anxiety, or mood disorders.  Experiencing a stressful life event during pregnancy, such as the death of a loved one.  Having additional life stress, such as being a single parent.  Having thyroid problems. What are the signs or symptoms? Symptoms of this condition include:  Physical symptoms, such as: ? Panic attacks. These are intense episodes of fear or discomfort that may also cause sweating, nausea, shortness of breath, or fear of dying. They usually last 5-15 minutes but can last longer. ? Performing repetitive tasks to relieve stress or worry (obsessive compulsive disorder, or OCD). ? Problems with appetite or sleep.  Emotional symptoms, such as: ? Excessive worry about problems or feeling like something bad will happen (generalized anxiety disorder). ? Phobias, which are fears of certain objects or situations. ? Separation anxiety, or fear and stress about leaving certain people or loved ones.  Behavioral symptoms, such as: ? Depression, or lack of motivation and energy. ? Intense mood swings involving emotional highs and lows. ? Feeling out of control or like you are going crazy. ? Having  difficulty bonding with your baby. Some people also have trouble relaxing, problems concentrating, problems sleeping, frequent nightmares, and disturbing thoughts. PMAD can be different for everyone and can affect men as well as women. How is this diagnosed? This condition is diagnosed based on a physical exam and mental evaluation.  In some cases, your health care provider may use an anxiety or depression screening tool. This includes a list of questions that can help a health care provider diagnose PMAD.  You may be referred to a mental health expert who specializes in treating PMAD. How is this treated? This condition may be treated with:  Talk therapy with a mental health professional. This may be family therapy, marriage therapy, cognitive behavioral therapy, or interpersonal therapy.  Medicines. Your health care provider will discuss medicines that are safe to use during pregnancy and breastfeeding.  Stress reduction therapies, such as mindfulness, deep breathing, or guided muscle relaxation.  Support groups, early childhood education, or other groups to help with being a parent.   Follow these instructions at home: Lifestyle  Do not use any products that contain nicotine or tobacco. These products include cigarettes, chewing tobacco, and vaping devices, such as e-cigarettes. If you need help quitting, ask your health care provider.  Do not drink alcohol when you are pregnant. It is also safest not to drink alcohol if you are breastfeeding. ? After your baby is born, if you drink alcohol:  Limit how much you have to 0-1 drink a day.  Be aware of how much alcohol is in your drink. In the [place] one drink equals one 12 oz bottle of beer (355 mL), one 5 oz glass of wine (148 mL), or one 1 oz glass of hard liquor (44 mL).  Consider joining a  support group for new mothers. Ask your health care provider for recommendations.  Take good care of yourself. Make sure you: ? Get as much  rest as possible. Talk with your partner about sharing the responsibility of getting up with your baby if possible. Make sleep a priority. ? Eat a healthy diet. This includes plenty of fruits and vegetables, whole grains, and lean proteins. ? Exercise regularly, as told by your health care provider. Ask your health care provider what exercises are safe for you. Talk with your partner about making sure you both have opportunities to exercise. General instructions  Take over-the-counter and prescription medicines only as told by your health care provider.  Talk with your partner or family members about your feelings during pregnancy. Share your concerns, needs, or anxieties with each other. Do not be afraid to ask for help. Find a mental health professional, if needed.  Ask for help with tasks or chores when you need it. Ask friends and family members to provide meals, watch your children, or help with cleaning. If friends or family are not able to help, consider finding a licensed child care provider or professional house cleaner if needed. Let your partner know what you need. He or she may be struggling too.  Keep all follow-up visits. This is important. Contact a health care provider if:  You or people close to you notice that you have symptoms of anxiety or depression.  Your symptoms of anxiety or depression get worse.  You take medicines and have side effects that are uncomfortable or difficult to tolerate. Get help right away if:  You feel like hurting yourself, your baby, or someone else. If you feel like you may hurt yourself or others, or have thoughts about taking your own life, get help right away. Go to your nearest emergency department or:  Call your local emergency services (911 in the [place]  Call a suicide crisis helpline, such as the National Suicide Prevention Lifeline at (360) 455-2950. This is open 24 hours a day in the [place]  Text the Crisis Text Line at 701-514-6491 (in the  [place] Summary  Perinatal mood and anxiety disorder (PMAD) is when a woman or her partner feels excessive sadness, anger, or worry and tension (anxiety) during pregnancy or during the first few months after the birth.  PMAD may include depression, intense mood swings, panic attacks, separation anxiety, phobias, or generalized anxiety.  PMAD can cause problems for the mother, the baby, or the father if not properly managed.  This condition is treated with medicines, talk therapy, stress reduction therapies, or a combination of treatments.  Talk with your partner or family members about your concerns or fears. Ask for help when you need it. This information is not intended to replace advice given to you by your health care provider. Make sure you discuss any questions you have with your health care provider. Document Revised: 12/08/2019 Document Reviewed: 12/08/2019 Elsevier Patient Education  2021 [employer]        Postpartum Baby Blues The postpartum period begins right after the birth of a baby. During this time, there is often joy and excitement. It is also a time of many changes in the life of the parents. A mother may feel happy one minute and sad or stressed the next. These feelings of sadness, called the baby blues, usually happen in the period right after the baby is born and go away within a week or two. What are the causes? The exact cause  of this condition is not known. Changes in hormone levels after childbirth are believed to trigger some of the symptoms. Other factors that can play a role in these mood changes include:  Lack of sleep.  Stressful life events, such as financial problems, caring for a loved one, or death of a loved one.  Genetics. What are the signs or symptoms? Symptoms of this condition include:  Changes in mood, such as going from extreme happiness to sadness.  A decrease in concentration.  Difficulty sleeping.  Crying spells and  tearfulness.  Loss of appetite.  Irritability.  Anxiety. If these symptoms last for more than 2 weeks or become more severe, you may have postpartum depression. How is this diagnosed? This condition is diagnosed based on an evaluation of your symptoms. Your health care provider may use a screening tool that includes a list of questions to help identify a person with the baby blues or postpartum depression. How is this treated? The baby blues usually go away on their own in 1-2 weeks. Social support is often what is needed. You will be encouraged to get adequate sleep and rest. Follow these instructions at home: Lifestyle  Get as much rest as you can. Take a nap when the baby sleeps.  Exercise regularly as told by your health care provider. Some women find yoga and walking to be helpful.  Eat a balanced and nourishing diet. This includes plenty of fruits and vegetables, whole grains, and lean proteins.  Do little things that you enjoy. Take a bubble bath, read your favorite magazine, or listen to your favorite music.  Avoid alcohol.  Ask for help with household chores, cooking, grocery shopping, or running errands. Do not try to do everything yourself. Consider hiring a postpartum doula to help. This is a professional who specializes in providing support to new mothers.  Try not to make any major life changes during pregnancy or right after giving birth. This can add stress.      General instructions  Talk to people close to you about how you are feeling. Get support from your partner, family members, friends, or other new moms. You may want to join a support group.  Find ways to manage stress. This may include: ? Writing your thoughts and feelings in a journal. ? Spending time outside. ? Spending time with people who make you laugh.  Try to stay positive in how you think. Think about the things you are grateful for.  Take over-the-counter and prescription medicines only as  told by your health care provider.  Let your health care provider know if you have any concerns.  Keep all postpartum visits. This is important. Contact a health care provider if:  Your baby blues do not go away after 2 weeks. Get help right away if:  You have thoughts of taking your own life (suicidal thoughts), or of harming your baby or someone else.  You see or hear things that are not there (hallucinations). If you ever feel like you may hurt yourself or others, or have thoughts about taking your own life, get help right away. Go to your nearest emergency department or:  Call your local emergency services (911 in the [place]  Call a suicide crisis helpline, such as the National Suicide Prevention Lifeline, at (985) 005-7346. This is open 24 hours a day in the [place]  Text the Crisis Text Line at 217-305-8367 (in the [place] Summary  After giving birth, you may feel happy one minute and sad  or stressed the next. Feelings of sadness that happen right after the baby is born and go away after a week or two are called the baby blues.  You can manage the baby blues by getting enough rest, eating a healthy diet, exercising, spending time with supportive people, and finding ways to manage stress.  If feelings of sadness and stress last longer than 2 weeks or get in the way of caring for your baby, talk with your health care provider. This may mean you have postpartum depression. This information is not intended to replace advice given to you by your health care provider. Make sure you discuss any questions you have with your health care provider. Document Revised: 12/08/2019 Document Reviewed: 12/08/2019 Elsevier Patient Education  2021 [employer]        Preterm Labor The normal length of a pregnancy is 39-41 weeks. Preterm labor is when labor starts before 37 completed weeks of pregnancy. Babies who are born prematurely and survive may not be fully developed and may be at an increased  risk for long-term problems such as cerebral palsy, developmental delays, and vision and hearing problems. Babies who are born too early may have problems soon after birth. Problems may include regulating blood sugar, body temperature, heart rate, and breathing rate. These babies often have trouble with feeding. The risk of having problems is highest for babies who are born before 34 weeks of pregnancy. What are the causes? The exact cause of this condition is not known. What increases the risk? You are more likely to have preterm labor if you have certain risk factors that relate to your medical history, problems with present and past pregnancies, and lifestyle factors. Medical history  You have abnormalities of the uterus, including a short cervix.  You have STIs (sexually transmitted infections), or other infections of the urinary tract and the vagina.  You have chronic illnesses, such as blood clotting problems, diabetes, or high blood pressure.  You are overweight or underweight. Present and past pregnancies  You have had preterm labor before.  You are pregnant with twins or other multiples.  You have been diagnosed with a condition in which the placenta covers your cervix (placenta previa).  You waited less than 6 months between giving birth and becoming pregnant again.  Your unborn baby has some abnormalities.  You have vaginal bleeding during pregnancy.  You became pregnant through in vitro fertilization (IVF). Lifestyle and environmental factors  You use tobacco products.  You drink alcohol.  You use street drugs.  You have stress and no social support.  You experience domestic violence.  You are exposed to certain chemicals or environmental pollutants. Other factors  You are younger than age 67 or older than age 83. What are the signs or symptoms? Symptoms of this condition include:  Cramps similar to those that can happen during a menstrual period. The  cramps may happen with diarrhea.  Pain in the abdomen or lower back.  Regular contractions that may feel like tightening of the abdomen.  A feeling of increased pressure in the pelvis.  Increased watery or bloody mucus discharge from the vagina.  Water breaking (ruptured amniotic sac). How is this diagnosed? This condition is diagnosed based on:  Your medical history and a physical exam.  A pelvic exam.  An ultrasound.  Monitoring your uterus for contractions.  Other tests, including: ? A swab of the cervix to check for a chemical called fetal fibronectin. ? Urine tests. How is this  treated? Treatment for this condition depends on the length of your pregnancy, your condition, and the health of your baby. Treatment may include:  Taking medicines, such as: ? Hormone medicines. These may be given early in pregnancy to help support the pregnancy. ? Medicines to stop contractions. ? Medicines to help mature the baby's lungs. These may be prescribed if the risk of delivery is high. ? Medicines to prevent your baby from developing cerebral palsy.  Bed rest. If the labor happens before 34 weeks of pregnancy, you may need to stay in the hospital.  Delivery of the baby. Follow these instructions at home:  Do not use any products that contain nicotine or tobacco, such as cigarettes, e-cigarettes, and chewing tobacco. If you need help quitting, ask your health care provider.  Do not drink alcohol.  Take over-the-counter and prescription medicines only as told by your health care provider.  Rest as told by your health care provider.  Return to your normal activities as told by your health care provider. Ask your health care provider what activities are safe for you.  Keep all follow-up visits as told by your health care provider. This is important.   How is this prevented? To increase your chance of having a full-term pregnancy:  Do not use street drugs or medicines that have  not been prescribed to you during your pregnancy.  Talk with your health care provider before taking any herbal supplements, even if you have been taking them regularly.  Make sure you gain a healthy amount of weight during your pregnancy.  Watch for infection. If you think that you might have an infection, get it checked right away. Symptoms of infection may include: ? Fever. ? Abnormal vaginal discharge or discharge that smells bad. ? Pain or burning with urination. ? Needing to urinate urgently. ? Frequently urinating or passing small amounts of urine frequently. ? Blood in your urine. ? Urine that smells bad or unusual.  Tell your health care provider if you have had preterm labor before. Contact a health care provider if:  You think you are going into preterm labor.  You have signs or symptoms of preterm labor.  You have symptoms of infection. Get help right away if:  You are having regular, painful contractions every 5 minutes or less.  Your water breaks. Summary  Preterm labor is labor that starts before you reach 37 weeks of pregnancy.  Delivering your baby early increases your baby's risk of developing lifelong problems.  The exact cause of preterm labor is unknown. However, having an abnormal uterus, an STI (sexually transmitted infection), or vaginal bleeding during pregnancy increases your risk for preterm labor.  Keep all follow-up visits as told by your health care provider. This is important.  Contact a health care provider if you have signs or symptoms of preterm labor. This information is not intended to replace advice given to you by your health care provider. Make sure you discuss any questions you have with your health care provider. Document Revised: 07/18/2019 Document Reviewed: 07/18/2019 Elsevier Patient Education  2021 [employer] Inc.        Round Ligament Pain  The round ligament is a cord of muscle and tissue that helps support the uterus.  It can become a source of pain during pregnancy if it becomes stretched or twisted as the baby grows. The pain usually begins in the second trimester (13-28 weeks) of pregnancy, and it can come and go until the baby is delivered. It  is not a serious problem, and it does not cause harm to the baby. Round ligament pain is usually a short, sharp, and pinching pain, but it can also be a dull, lingering, and aching pain. The pain is felt in the lower side of the abdomen or in the groin. It usually starts deep in the groin and moves up to the outside of the hip area. The pain may occur when you:  Suddenly change position, such as quickly going from a sitting to standing position.  Roll over in bed.  Cough or sneeze.  Do physical activity. Follow these instructions at home:  Watch your condition for any changes.  When the pain starts, relax. Then try any of these methods to help with the pain: ? Sitting down. ? Flexing your knees up to your abdomen. ? Lying on your side with one pillow under your abdomen and another pillow between your legs. ? Sitting in a warm bath for 15-20 minutes or until the pain goes away.  Take over-the-counter and prescription medicines only as told by your health care provider.  Move slowly when you sit down or stand up.  Avoid long walks if they cause pain.  Stop or reduce your physical activities if they cause pain.  Keep all follow-up visits as told by your health care provider. This is important.   Contact a health care provider if:  Your pain does not go away with treatment.  You feel pain in your back that you did not have before.  Your medicine is not helping. Get help right away if:  You have a fever or chills.  You develop uterine contractions.  You have vaginal bleeding.  You have nausea or vomiting.  You have diarrhea.  You have pain when you urinate. Summary  Round ligament pain is felt in the lower abdomen or groin. It is usually a  short, sharp, and pinching pain. It can also be a dull, lingering, and aching pain.  This pain usually begins in the second trimester (13-28 weeks). It occurs because the uterus is stretching with the growing baby, and it is not harmful to the baby.  You may notice the pain when you suddenly change position, when you cough or sneeze, or during physical activity.  Relaxing, flexing your knees to your abdomen, lying on one side, or taking a warm bath may help to get rid of the pain.  Get help from your health care provider if the pain does not go away or if you have vaginal bleeding, nausea, vomiting, diarrhea, or painful urination. This information is not intended to replace advice given to you by your health care provider. Make sure you discuss any questions you have with your health care provider. Document Revised: 12/01/2017 Document Reviewed: 12/01/2017 Elsevier Patient Education  2021 [employer] Inc.        Insomnia Insomnia is a sleep disorder that makes it difficult to fall asleep or stay asleep. Insomnia can cause fatigue, low energy, difficulty concentrating, mood swings, and poor performance at work or school. There are three different ways to classify insomnia:  Difficulty falling asleep.  Difficulty staying asleep.  Waking up too early in the morning. Any type of insomnia can be long-term (chronic) or short-term (acute). Both are common. Short-term insomnia usually lasts for three months or less. Chronic insomnia occurs at least three times a week for longer than three months. What are the causes? Insomnia may be caused by another condition, situation, or substance, such as:  Anxiety.  Certain medicines.  Gastroesophageal reflux disease (GERD) or other gastrointestinal conditions.  Asthma or other breathing conditions.  Restless legs syndrome, sleep apnea, or other sleep disorders.  Chronic pain.  Menopause.  Stroke.  Abuse of alcohol, tobacco, or illegal  drugs.  Mental health conditions, such as depression.  Caffeine.  Neurological disorders, such as Alzheimer's disease.  An overactive thyroid (hyperthyroidism). Sometimes, the cause of insomnia may not be known. What increases the risk? Risk factors for insomnia include:  Gender. Women are affected more often than men.  Age. Insomnia is more common as you get older.  Stress.  Lack of exercise.  Irregular work schedule or working night shifts.  Traveling between different time zones.  Certain medical and mental health conditions. What are the signs or symptoms? If you have insomnia, the main symptom is having trouble falling asleep or having trouble staying asleep. This may lead to other symptoms, such as:  Feeling fatigued or having low energy.  Feeling nervous about going to sleep.  Not feeling rested in the morning.  Having trouble concentrating.  Feeling irritable, anxious, or depressed. How is this diagnosed? This condition may be diagnosed based on:  Your symptoms and medical history. Your health care provider may ask about: ? Your sleep habits. ? Any medical conditions you have. ? Your mental health.  A physical exam. How is this treated? Treatment for insomnia depends on the cause. Treatment may focus on treating an underlying condition that is causing insomnia. Treatment may also include:  Medicines to help you sleep.  Counseling or therapy.  Lifestyle adjustments to help you sleep better. Follow these instructions at home: Eating and drinking  Limit or avoid alcohol, caffeinated beverages, and cigarettes, especially close to bedtime. These can disrupt your sleep.  Do not eat a large meal or eat spicy foods right before bedtime. This can lead to digestive discomfort that can make it hard for you to sleep.   Sleep habits  Keep a sleep diary to help you and your health care provider figure out what could be causing your insomnia. Write  down: ? When you sleep. ? When you wake up during the night. ? How well you sleep. ? How rested you feel the next day. ? Any side effects of medicines you are taking. ? What you eat and drink.  Make your bedroom a dark, comfortable place where it is easy to fall asleep. ? Put up shades or blackout curtains to block light from outside. ? Use a white noise machine to block noise. ? Keep the temperature cool.  Limit screen use before bedtime. This includes: ? Watching TV. ? Using your smartphone, tablet, or computer.  Stick to a routine that includes going to bed and waking up at the same times every day and night. This can help you fall asleep faster. Consider making a quiet activity, such as reading, part of your nighttime routine.  Try to avoid taking naps during the day so that you sleep better at night.  Get out of bed if you are still awake after 15 minutes of trying to sleep. Keep the lights down, but try reading or doing a quiet activity. When you feel sleepy, go back to bed.   General instructions  Take over-the-counter and prescription medicines only as told by your health care provider.  Exercise regularly, as told by your health care provider. Avoid exercise starting several hours before bedtime.  Use relaxation techniques to manage stress. Ask your health  care provider to suggest some techniques that may work well for you. These may include: ? Breathing exercises. ? Routines to release muscle tension. ? Visualizing peaceful scenes.  Make sure that you drive carefully. Avoid driving if you feel very sleepy.  Keep all follow-up visits as told by your health care provider. This is important. Contact a health care provider if:  You are tired throughout the day.  You have trouble in your daily routine due to sleepiness.  You continue to have sleep problems, or your sleep problems get worse. Get help right away if:  You have serious thoughts about hurting yourself or  someone else. If you ever feel like you may hurt yourself or others, or have thoughts about taking your own life, get help right away. You can go to your nearest emergency department or call:  Your local emergency services (911 in the [place]  A suicide crisis helpline, such as the National Suicide Prevention Lifeline at 6476791979. This is open 24 hours a day. Summary  Insomnia is a sleep disorder that makes it difficult to fall asleep or stay asleep.  Insomnia can be long-term (chronic) or short-term (acute).  Treatment for insomnia depends on the cause. Treatment may focus on treating an underlying condition that is causing insomnia.  Keep a sleep diary to help you and your health care provider figure out what could be causing your insomnia. This information is not intended to replace advice given to you by your health care provider. Make sure you discuss any questions you have with your health care provider. Document Revised: 04/25/2020 Document Reviewed: 04/25/2020 Elsevier Patient Education  2021 [employer].

## 2020-10-04 LAB — AFP, SERUM, OPEN SPINA BIFIDA
AFP MoM: 1.18
AFP Value: 55.2 ng/mL
Gest. Age on Collection Date: 19.5 weeks
Maternal Age At EDD: 32.1 yr
OSBR Risk 1 IN: 6916
Test Results:: NEGATIVE
Weight: 199 [lb_av]

## 2020-10-07 ENCOUNTER — Ambulatory Visit: Payer: 59 | Admitting: *Deleted

## 2020-10-07 ENCOUNTER — Encounter: Payer: Self-pay | Admitting: *Deleted

## 2020-10-07 ENCOUNTER — Other Ambulatory Visit: Payer: Self-pay

## 2020-10-07 ENCOUNTER — Ambulatory Visit: Payer: 59 | Attending: Obstetrics and Gynecology

## 2020-10-07 VITALS — BP 121/74 | HR 98

## 2020-10-07 DIAGNOSIS — Z6830 Body mass index (BMI) 30.0-30.9, adult: Secondary | ICD-10-CM | POA: Diagnosis present

## 2020-10-07 DIAGNOSIS — Z3400 Encounter for supervision of normal first pregnancy, unspecified trimester: Secondary | ICD-10-CM

## 2020-10-15 ENCOUNTER — Ambulatory Visit (INDEPENDENT_AMBULATORY_CARE_PROVIDER_SITE_OTHER): Payer: 59 | Admitting: Licensed Clinical Social Worker

## 2020-10-15 DIAGNOSIS — Z3A00 Weeks of gestation of pregnancy not specified: Secondary | ICD-10-CM | POA: Diagnosis not present

## 2020-10-15 DIAGNOSIS — O99340 Other mental disorders complicating pregnancy, unspecified trimester: Secondary | ICD-10-CM | POA: Diagnosis not present

## 2020-10-15 DIAGNOSIS — F419 Anxiety disorder, unspecified: Secondary | ICD-10-CM

## 2020-10-16 NOTE — BH Specialist Note (Signed)
Integrated Behavioral Health via Telemedicine Visit  10/16/2020 Ravenne Wayment [id number]  Number of Integrated Behavioral Health visits: 1/6 Session Start time: 10:00am   Session End time: 10:28am Total time: 28 mins via mychart video   Referring Provider: Farris Has NP Patient/Family location: Home  [facility] Provider location: [facility]  All persons participating in visit: Pt J Gelinas, spouse Daneen Schick and LCSWA A. Alessander Sikorski  Types of Service: Individual psychotherapy and Video visit  I connected with Meridee Score and/or Chelise Crochet's spouse via  Telephone or Engineer, civil (consulting)  (Video is Surveyor, mining) and verified that I am speaking with the correct person using two identifiers. Discussed confidentiality: Yes   I discussed the limitations of telemedicine and the availability of in person appointments.  Discussed there is a possibility of technology failure and discussed alternative modes of communication if that failure occurs.  I discussed that engaging in this telemedicine visit, they consent to the provision of behavioral healthcare and the services will be billed under their insurance.  Patient and/or legal guardian expressed understanding and consented to Telemedicine visit: Yes   Presenting Concerns: Patient and/or family reports the following symptoms/concerns: anxiety, depressed mood, irritability and easily distracted  Duration of problem: increased symptoms since pregnancy; Severity of problem: mild  Patient and/or Family's Strengths/Protective Factors: Concrete supports in place (healthy food, safe environments, etc.)  Goals Addressed: Patient will: 1.  Reduce symptoms of: anxiety  2.  Increase knowledge and/or ability of: coping skills and stress reduction  3.  Demonstrate ability to: Increase motivation to adhere to plan of care  Progress towards Goals: Ongoing  Interventions: Interventions utilized:  Mindfulness or  Relaxation Training and Supportive Counseling Standardized Assessments completed: n/a  Assessment: Patient currently experiencing anxiety affecting pregnancy   Patient may benefit from integrated behavioral health   Plan: 1. Follow up with behavioral health clinician on : 3 weeks via mychart  2. Behavioral recommendations: Create smart goals, stressed importance of practicing positive sleep hygiene, incorporate mindfulness to assist with distractibility 3. Referral(s): Integrated [employer] (In Clinic)  I discussed the assessment and treatment plan with the patient and/or parent/guardian. They were provided an opportunity to ask questions and all were answered. They agreed with the plan and demonstrated an understanding of the instructions.   They were advised to call back or seek an in-person evaluation if the symptoms worsen or if the condition fails to improve as anticipated.  Gwyndolyn Saxon, LCSW

## 2020-10-27 DIAGNOSIS — Z419 Encounter for procedure for purposes other than remedying health state, unspecified: Secondary | ICD-10-CM | POA: Diagnosis not present

## 2020-10-30 ENCOUNTER — Other Ambulatory Visit: Payer: Self-pay

## 2020-10-30 ENCOUNTER — Ambulatory Visit (INDEPENDENT_AMBULATORY_CARE_PROVIDER_SITE_OTHER): Payer: 59 | Admitting: Women's Health

## 2020-10-30 VITALS — BP 110/74 | HR 102 | Wt 208.0 lb

## 2020-10-30 DIAGNOSIS — Z6830 Body mass index (BMI) 30.0-30.9, adult: Secondary | ICD-10-CM

## 2020-10-30 DIAGNOSIS — Z3A23 23 weeks gestation of pregnancy: Secondary | ICD-10-CM

## 2020-10-30 DIAGNOSIS — F339 Major depressive disorder, recurrent, unspecified: Secondary | ICD-10-CM

## 2020-10-30 DIAGNOSIS — F909 Attention-deficit hyperactivity disorder, unspecified type: Secondary | ICD-10-CM

## 2020-10-30 DIAGNOSIS — F419 Anxiety disorder, unspecified: Secondary | ICD-10-CM

## 2020-10-30 DIAGNOSIS — Z3400 Encounter for supervision of normal first pregnancy, unspecified trimester: Secondary | ICD-10-CM

## 2020-10-30 MED ORDER — HYDROXYZINE HCL 10 MG PO TABS: 10.0000 mg | ORAL_TABLET | Freq: Three times a day (TID) | ORAL | 0 refills | Status: DC | PRN

## 2020-10-30 NOTE — Patient Instructions (Addendum)
Maternity Assessment Unit (MAU)  The Maternity Assessment Unit (MAU) is located at the [facility] at [facility]. The address is: [address] [place], [place], [place] [address]. Please see map below for additional directions.    The Maternity Assessment Unit is designed to help you during your pregnancy, and for up to 6 weeks after delivery, with any pregnancy- or postpartum-related emergencies, if you think you are in labor, or if your water has broken. For example, if you experience nausea and vomiting, vaginal bleeding, severe abdominal or pelvic pain, elevated blood pressure or other problems related to your pregnancy or postpartum time, please come to the Maternity Assessment Unit for assistance.        Oral Glucose Tolerance Test During Pregnancy Why am I having this test? The oral glucose tolerance test (OGTT) is done to check how your body processes blood sugar (glucose). This is one of several tests used to diagnose diabetes that develops during pregnancy (gestational diabetes mellitus). Gestational diabetes is a short-term form of diabetes that some women develop while they are pregnant. It usually occurs during the second trimester of pregnancy and goes away after delivery. Testing, or screening, for gestational diabetes usually occurs at weeks 24-28 of pregnancy. You may have the OGTT test after having a 1-hour glucose screening test if the results from that test indicate that you may have gestational diabetes. This test may also be needed if:  You have a history of gestational diabetes.  There is a history of giving birth to very large babies or of losing pregnancies (having stillbirths).  You have signs and symptoms of diabetes, such as: ? Changes in your eyesight. ? Tingling or numbness in your hands or feet. ? Changes in hunger, thirst, and urination, and these are not explained by your pregnancy. What is being tested? This test  measures the amount of glucose in your blood at different times during a period of 3 hours. This shows how well your body can process glucose. What kind of sample is taken? Blood samples are required for this test. They are usually collected by inserting a needle into a blood vessel.   How do I prepare for this test?  For 3 days before your test, eat normally. Have plenty of carbohydrate-rich foods.  Follow instructions from your health care provider about: ? Eating or drinking restrictions on the day of the test. You may be asked not to eat or drink anything other than water (to fast) starting 8-10 hours before the test. ? Changing or stopping your regular medicines. Some medicines may interfere with this test. Tell a health care provider about:  All medicines you are taking, including vitamins, herbs, eye drops, creams, and over-the-counter medicines.  Any blood disorders you have.  Any surgeries you have had.  Any medical conditions you have. What happens during the test? First, your blood glucose will be measured. This is referred to as your fasting blood glucose because you fasted before the test. Then, you will drink a glucose solution that contains a certain amount of glucose. Your blood glucose will be measured again 1, 2, and 3 hours after you drink the solution. This test takes about 3 hours to complete. You will need to stay at the testing location during this time. During the testing period:  Do not eat or drink anything other than the glucose solution.  Do not exercise.  Do not use any products that contain nicotine or tobacco, such as  cigarettes, e-cigarettes, and chewing tobacco. These can affect your test results. If you need help quitting, ask your health care provider. The testing procedure may vary among health care providers and hospitals. How are the results reported? Your results will be reported as milligrams of glucose per deciliter of blood (mg/dL) or millimoles  per liter (mmol/L). There is more than one source for screening and diagnosis reference values used to diagnose gestational diabetes. Your health care provider will compare your results to normal values that were established after testing a large group of people (reference values). Reference values may vary among labs and hospitals. For this test (Carpenter-Coustan), reference values are:  Fasting: 95 mg/dL (5.3 mmol/L).  1 hour: 180 mg/dL (91.4 mmol/L).  2 hour: 155 mg/dL (8.6 mmol/L).  3 hour: 140 mg/dL (7.8 mmol/L). What do the results mean? Results below the reference values are considered normal. If two or more of your blood glucose levels are at or above the reference values, you may be diagnosed with gestational diabetes. If only one level is high, your health care provider may suggest repeat testing or other tests to confirm a diagnosis. Talk with your health care provider about what your results mean. Questions to ask your health care provider Ask your health care provider, or the department that is doing the test:  When will my results be ready?  How will I get my results?  What are my treatment options?  What other tests do I need?  What are my next steps? Summary  The oral glucose tolerance test (OGTT) is one of several tests used to diagnose diabetes that develops during pregnancy (gestational diabetes mellitus). Gestational diabetes is a short-term form of diabetes that some women develop while they are pregnant.  You may have the OGTT test after having a 1-hour glucose screening test if the results from that test show that you may have gestational diabetes. You may also have this test if you have any symptoms or risk factors for this type of diabetes.  Talk with your health care provider about what your results mean. This information is not intended to replace advice given to you by your health care provider. Make sure you discuss any questions you have with your health  care provider. Document Revised: 11/23/2019 Document Reviewed: 11/23/2019 Elsevier Patient Education  2021 Elsevier Inc.        Second Trimester of Pregnancy  The second trimester of pregnancy is from week 13 through week 27. This is months 4 through 6 of pregnancy. The second trimester is often a time when you feel your best. Your body has adjusted to being pregnant, and you begin to feel better physically. During the second trimester:  Morning sickness has lessened or stopped completely.  You may have more energy.  You may have an increase in appetite. The second trimester is also a time when the unborn baby (fetus) is growing rapidly. At the end of the sixth month, the fetus may be up to 12 inches long and weigh about 1 pounds. You will likely begin to feel the baby move (quickening) between 16 and 20 weeks of pregnancy. Body changes during your second trimester Your body continues to go through many changes during your second trimester. The changes vary and generally return to normal after the baby is born. Physical changes  Your weight will continue to increase. You will notice your lower abdomen bulging out.  You may begin to get stretch marks on your hips, abdomen, and breasts.  Your breasts will continue to grow and to become tender.  Dark spots or blotches (chloasma or mask of pregnancy) may develop on your face.  A dark line from your belly button to the pubic area (linea nigra) may appear.  You may have changes in your hair. These can include thickening of your hair, rapid growth, and changes in texture. Some people also have hair loss during or after pregnancy, or hair that feels dry or thin. Health changes  You may develop headaches.  You may have heartburn.  You may develop constipation.  You may develop hemorrhoids or swollen, bulging veins (varicose veins).  Your gums may bleed and may be sensitive to brushing and flossing.  You may urinate more often  because the fetus is pressing on your bladder.  You may have back pain. This is caused by: ? Weight gain. ? Pregnancy hormones that are relaxing the joints in your pelvis. ? A shift in weight and the muscles that support your balance. Follow these instructions at home: Medicines  Follow your health care provider's instructions regarding medicine use. Specific medicines may be either safe or unsafe to take during pregnancy. Do not take any medicines unless approved by your health care provider.  Take a prenatal vitamin that contains at least 600 micrograms (mcg) of folic acid. Eating and drinking  Eat a healthy diet that includes fresh fruits and vegetables, whole grains, good sources of protein such as meat, eggs, or tofu, and low-fat dairy products.  Avoid raw meat and unpasteurized juice, milk, and cheese. These carry germs that can harm you and your baby.  You may need to take these actions to prevent or treat constipation: ? Drink enough fluid to keep your urine pale yellow. ? Eat foods that are high in fiber, such as beans, whole grains, and fresh fruits and vegetables. ? Limit foods that are high in fat and processed sugars, such as fried or sweet foods. Activity  Exercise only as directed by your health care provider. Most people can continue their usual exercise routine during pregnancy. Try to exercise for 30 minutes at least 5 days a week. Stop exercising if you develop contractions in your uterus.  Stop exercising if you develop pain or cramping in the lower abdomen or lower back.  Avoid exercising if it is very hot or humid or if you are at a high altitude.  Avoid heavy lifting.  If you choose to, you may have sex unless your health care provider tells you not to. Relieving pain and discomfort  Wear a supportive bra to prevent discomfort from breast tenderness.  Take warm sitz baths to soothe any pain or discomfort caused by hemorrhoids. Use hemorrhoid cream if your  health care provider approves.  Rest with your legs raised (elevated) if you have leg cramps or low back pain.  If you develop varicose veins: ? Wear support hose as told by your health care provider. ? Elevate your feet for 15 minutes, 3-4 times a day. ? Limit salt in your diet. Safety  Wear your seat belt at all times when driving or riding in a car.  Talk with your health care provider if someone is verbally or physically abusive to you. Lifestyle  Do not use hot tubs, steam rooms, or saunas.  Do not douche. Do not use tampons or scented sanitary pads.  Avoid cat litter boxes and soil used by cats. These carry germs that can cause birth defects in the baby and possibly loss  of the fetus by miscarriage or stillbirth.  Do not use herbal remedies, alcohol, illegal drugs, or medicines that are not approved by your health care provider. Chemicals in these products can harm your baby.  Do not use any products that contain nicotine or tobacco, such as cigarettes, e-cigarettes, and chewing tobacco. If you need help quitting, ask your health care provider. General instructions  During a routine prenatal visit, your health care provider will do a physical exam and other tests. He or she will also discuss your overall health. Keep all follow-up visits. This is important.  Ask your health care provider for a referral to a local prenatal education class.  Ask for help if you have counseling or nutritional needs during pregnancy. Your health care provider can offer advice or refer you to specialists for help with various needs. Where to find more information  American Pregnancy Association: americanpregnancy.org  [employer]: https://www.todd-brady.net/  Office on [employer] [facility]: MightyReward.[employer].nz Contact a health care provider if you have:  A headache that does not go away when you take medicine.  Vision changes or you see  spots in front of your eyes.  Mild pelvic cramps, pelvic pressure, or nagging pain in the abdominal area.  Persistent nausea, vomiting, or diarrhea.  A bad-smelling vaginal discharge or foul-smelling urine.  Pain when you urinate.  Sudden or extreme swelling of your face, hands, ankles, feet, or legs.  A fever. Get help right away if you:  Have fluid leaking from your vagina.  Have spotting or bleeding from your vagina.  Have severe abdominal cramping or pain.  Have difficulty breathing.  Have chest pain.  Have fainting spells.  Have not felt your baby move for the time period told by your health care provider.  Have new or increased pain, swelling, or redness in an arm or leg. Summary  The second trimester of pregnancy is from week 13 through week 27 (months 4 through 6).  Do not use herbal remedies, alcohol, illegal drugs, or medicines that are not approved by your health care provider. Chemicals in these products can harm your baby.  Exercise only as directed by your health care provider. Most people can continue their usual exercise routine during pregnancy.  Keep all follow-up visits. This is important. This information is not intended to replace advice given to you by your health care provider. Make sure you discuss any questions you have with your health care provider. Document Revised: 11/22/2019 Document Reviewed: 09/28/2019 Elsevier Patient Education  2021 Elsevier Inc.       Hydroxyzine capsules or tablets What is this medicine? HYDROXYZINE (hye DROX i zeen) is an antihistamine. This medicine is used to treat allergy symptoms. It is also used to treat anxiety and tension. This medicine can be used with other medicines to induce sleep before surgery. This medicine may be used for other purposes; ask your health care provider or pharmacist if you have questions. COMMON BRAND NAME(S): ANX, Atarax, Rezine, Vistaril What should I tell my health care  provider before I take this medicine? They need to know if you have any of these conditions:  glaucoma  heart disease  history of irregular heartbeat  kidney disease  liver disease  lung or breathing disease, like asthma  stomach or intestine problems  thyroid disease  trouble passing urine  an unusual or allergic reaction to hydroxyzine, cetirizine, other medicines, foods, dyes or preservatives  pregnant or trying to get pregnant  breast-feeding How should  I use this medicine? Take this medicine by mouth with a full glass of water. Follow the directions on the prescription label. You may take this medicine with food or on an empty stomach. Take your medicine at regular intervals. Do not take your medicine more often than directed. Talk to your pediatrician regarding the use of this medicine in children. Special care may be needed. While this drug may be prescribed for children as young as 53 years of age for selected conditions, precautions do apply. Patients over 79 years old may have a stronger reaction and need a smaller dose. Overdosage: If you think you have taken too much of this medicine contact a poison control center or emergency room at once. NOTE: This medicine is only for you. Do not share this medicine with others. What if I miss a dose? If you miss a dose, take it as soon as you can. If it is almost time for your next dose, take only that dose. Do not take double or extra doses. What may interact with this medicine? Do not take this medicine with any of the following medications:  cisapride  dronedarone  pimozide  thioridazine This medicine may also interact with the following medications:  alcohol  antihistamines for allergy, cough, and cold  atropine  barbiturate medicines for sleep or seizures, like phenobarbital  certain antibiotics like erythromycin or clarithromycin  certain medicines for anxiety or sleep  certain medicines for bladder  problems like oxybutynin, tolterodine  certain medicines for depression or psychotic disturbances  certain medicines for irregular heart beat  certain medicines for Parkinson's disease like benztropine, trihexyphenidyl  certain medicines for seizures like phenobarbital, primidone  certain medicines for stomach problems like dicyclomine, hyoscyamine  certain medicines for travel sickness like scopolamine  ipratropium  narcotic medicines for pain  other medicines that prolong the QT interval (an abnormal heart rhythm) like dofetilide This list may not describe all possible interactions. Give your health care provider a list of all the medicines, herbs, non-prescription drugs, or dietary supplements you use. Also tell them if you smoke, drink alcohol, or use illegal drugs. Some items may interact with your medicine. What should I watch for while using this medicine? Tell your doctor or health care professional if your symptoms do not improve. You may get drowsy or dizzy. Do not drive, use machinery, or do anything that needs mental alertness until you know how this medicine affects you. Do not stand or sit up quickly, especially if you are an older patient. This reduces the risk of dizzy or fainting spells. Alcohol may interfere with the effect of this medicine. Avoid alcoholic drinks. Your mouth may get dry. Chewing sugarless gum or sucking hard candy, and drinking plenty of water may help. Contact your doctor if the problem does not go away or is severe. This medicine may cause dry eyes and blurred vision. If you wear contact lenses you may feel some discomfort. Lubricating drops may help. See your eye doctor if the problem does not go away or is severe. If you are receiving skin tests for allergies, tell your doctor you are using this medicine. What side effects may I notice from receiving this medicine? Side effects that you should report to your doctor or health care professional as soon  as possible:  allergic reactions like skin rash, itching or hives, swelling of the face, lips, or tongue  changes in vision  confusion  fast, irregular heartbeat  seizures  tremor  trouble passing urine  or change in the amount of urine Side effects that usually do not require medical attention (report to your doctor or health care professional if they continue or are bothersome):  constipation  drowsiness  dry mouth  headache  tiredness This list may not describe all possible side effects. Call your doctor for medical advice about side effects. You may report side effects to FDA at 1-800-FDA-1088. Where should I keep my medicine? Keep out of the reach of children. Store at room temperature between 15 and 30 degrees C (59 and 86 degrees F). Keep container tightly closed. Throw away any unused medicine after the expiration date. NOTE: This sheet is a summary. It may not cover all possible information. If you have questions about this medicine, talk to your doctor, pharmacist, or health care provider.  2021 Elsevier/Gold Standard (2018-06-06 13:19:55)                        Safe Medications in Pregnancy    Acne: Benzoyl Peroxide Salicylic Acid  Backache/Headache: Tylenol: 2 regular strength every 4 hours OR              2 Extra strength every 6 hours  Colds/Coughs/Allergies: Benadryl (alcohol free) 25 mg every 6 hours as needed Breath right strips Claritin Cepacol throat lozenges Chloraseptic throat spray Cold-Eeze- up to three times per day Cough drops, alcohol free Flonase (by prescription only) Guaifenesin Mucinex Robitussin DM (plain only, alcohol free) Saline nasal spray/drops Sudafed (pseudoephedrine) & Actifed ** use only after [redacted] weeks gestation and if you do not have high blood pressure Tylenol Vicks Vaporub Zinc lozenges Zyrtec   Constipation: Colace Ducolax suppositories Fleet enema Glycerin suppositories Metamucil Milk of  magnesia Miralax Senokot Smooth move tea  Diarrhea: Kaopectate Imodium A-D  *NO pepto Bismol  Hemorrhoids: Anusol Anusol HC Preparation H Tucks  Indigestion: Tums Maalox Mylanta Zantac  Pepcid  Insomnia: Benadryl (alcohol free) 25mg  every 6 hours as needed Tylenol PM Unisom, no Gelcaps  Leg Cramps: Tums MagGel  Nausea/Vomiting:  Bonine Dramamine Emetrol Ginger extract Sea bands Meclizine  Nausea medication to take during pregnancy:  Unisom (doxylamine succinate 25 mg tablets) Take one tablet daily at bedtime. If symptoms are not adequately controlled, the dose can be increased to a maximum recommended dose of two tablets daily (1/2 tablet in the morning, 1/2 tablet mid-afternoon and one at bedtime). Vitamin B6 100mg  tablets. Take one tablet twice a day (up to 200 mg per day).  Skin Rashes: Aveeno products Benadryl cream or 25mg  every 6 hours as needed Calamine Lotion 1% cortisone cream  Yeast infection: Gyne-lotrimin 7 Monistat 7   **If taking multiple medications, please check labels to avoid duplicating the same active ingredients **take medication as directed on the label ** Do not exceed 4000 mg of tylenol in 24 hours **Do not take medications that contain aspirin or ibuprofen           Eating Plan for Pregnant Women While you are pregnant, your body requires additional nutrition to help support your growing baby. You also have a higher need for some vitamins and minerals, such as folic acid, calcium, iron, and vitamin D. Eating a healthy, well-balanced diet is very important for your health and your baby's health. Your need for extra calories varies over the course of your pregnancy. Pregnancy is divided into three trimesters, with each trimester lasting 3 months. For most women, it is recommended to consume:  150 extra calories a day during  the first trimester.  300 extra calories a day during the second trimester.  300 extra calories  a day during the third trimester. What are tips for following this plan? Cooking  Practice good food safety and cleanliness. Wash your hands before you eat and after you prepare raw meat. Wash all fruits and vegetables well before peeling or eating. Taking these actions can help to prevent foodborne illnesses that can be very dangerous to your baby, such as listeriosis. Ask your health care provider for more information about listeriosis.  Make sure that all meats, poultry, and eggs are cooked to food-safe temperatures or "well-done." Meal planning  Eat a variety of foods (especially fruits and vegetables) to get a full range of vitamins and minerals.  Two or more servings of fish are recommended each week in order to get the most benefits from omega-3 fatty acids that are found in seafood. Choose fish that are lower in mercury, such as salmon and pollock.  Limit your overall intake of foods that have "empty calories." These are foods that have little nutritional value, such as sweets, desserts, candies, and sugar-sweetened beverages.  Drinks that contain caffeine are okay to drink, but it is better to avoid caffeine. Keep your total caffeine intake to less than 200 mg each day (which is 12 oz or 355 mL of coffee, tea, or soda) or the limit as told by your health care provider.   General information  Do not try to lose weight or go on a diet during pregnancy.  Take a prenatal vitamin to help meet your additional vitamin and mineral needs during pregnancy, specifically for folic acid, iron, calcium, and vitamin D.  Remember to stay active. Ask your health care provider what types of exercise and activities are safe for you. What does 150 extra calories look like? Healthy options that provide 150 extra calories each day could be any of the following:  6-8 oz (170-227 g) plain low-fat yogurt with  cup (70 g) berries.  1 apple with 2 tsp (11 g) peanut butter.  Cut-up vegetables with  cup  (60 g) hummus.  8 fl oz (237 mL) low-fat chocolate milk.  1 stick of string cheese with 1 medium orange.  1 peanut butter and jelly sandwich that is made with one slice of whole-wheat bread and 1 tsp (5 g) of peanut butter. For 300 extra calories, you could eat two of these healthy options each day. What is a healthy amount of weight to gain? The right amount of weight gain for you is based on your BMI (body mass index) before you became pregnant.  If your BMI was less than 18 (underweight), you should gain 28-40 lb (13-18 kg).  If your BMI was 18-24.9 (normal), you should gain 25-35 lb (11-16 kg).  If your BMI was 25-29.9 (overweight), you should gain 15-25 lb (7-11 kg).  If your BMI was 30 or greater (obese), you should gain 11-20 lb (5-9 kg). What if I am having twins or multiples? Generally, if you are carrying twins or multiples:  You may need to eat 300-600 extra calories a day.  The recommended range for total weight gain is 25-54 lb (11-25 kg), depending on your BMI before pregnancy.  Talk with your health care provider to find out about nutritional needs, weight gain, and exercise that is right for you. What foods should I eat? Fruits All fruits. Eat a variety of colors and types of fruit. Remember to wash your fruits well  before peeling or eating. Vegetables All vegetables. Eat a variety of colors and types of vegetables. Remember to wash your vegetables well before peeling or eating. Grains All grains. Choose whole grains, such as whole-wheat bread, oatmeal, or brown rice. Meats and other protein foods Lean meats, including chicken, [place], and lean cuts of beef, veal, or pork. Fish that is higher in omega-3 fatty acids and lower in mercury, such as salmon, herring, mussels, trout, sardines, pollock, shrimp, crab, and lobster. Tofu. Tempeh. Beans. Eggs. Peanut butter and other nut butters. Dairy Pasteurized milk and milk alternatives, such as almond milk. Pasteurized  yogurt and pasteurized cheese. Cottage cheese. Sour cream. Beverages Water. Juices that contain 100% fruit juice or vegetable juice. Caffeine-free teas and decaffeinated coffee. Fats and oils Fats and oils are okay to include in moderation. Sweets and desserts Sweets and desserts are okay to include in moderation. Seasoning and other foods All pasteurized condiments. The items listed above may not be a complete list of foods and beverages you can eat. Contact a dietitian for more information.   What foods should I avoid? Fruits Raw (unpasteurized) fruit juices. Vegetables Unpasteurized vegetable juices. Meats and other protein foods Precooked or cured meat, such as bologna, hot dogs, sausages, or meat loaves. (If you must eat those meats, reheat them until they are steaming hot.) Refrigerated pate, meat spreads from a meat counter, or smoked seafood that is found in the refrigerated section of a store. Raw or undercooked meats, poultry, and eggs. Raw fish, such as sushi or sashimi. Fish that have high mercury content, such as tilefish, shark, swordfish, and king mackerel. Dairy Unpasteurized milk and any foods that have unpasteurized milk in them. Soft cheeses, such as feta, queso blanco, queso fresco, [place], [place], panela, and blue-veined cheeses (unless they are made with pasteurized milk, which must be stated on the label). Beverages Alcohol. Sugar-sweetened beverages, such as sodas, teas, or energy drinks. Seasoning and other foods Homemade fermented foods and drinks, such as pickles, sauerkraut, or kombucha drinks. (Store-bought pasteurized versions of these are okay.) Salads that are made in a store or deli, such as ham salad, chicken salad, egg salad, tuna salad, and seafood salad. The items listed above may not be a complete list of foods and beverages you should avoid. Contact a dietitian for more information. Where to find more information To calculate the number of calories  you need based on your height, weight, and activity level, you can use an online calculator such as:  PayStrike.dk To calculate how much weight you should gain during pregnancy, you can use an online pregnancy weight gain calculator such as:  http://www.harvey.com/ To learn more about eating fish during pregnancy, talk with your health care provider or visit:  PumpkinSearch.com.ee Summary  While you are pregnant, your body requires additional nutrition to help support your growing baby.  Eat a variety of foods, especially fruits and vegetables, to get a full range of vitamins and minerals.  Practice good food safety and cleanliness. Wash your hands before you eat and after you prepare raw meat. Wash all fruits and vegetables well before peeling or eating. Taking these actions can help to prevent foodborne illnesses, such as listeriosis, that can be very dangerous to your baby.  Do not eat raw meat or fish. Do not eat fish that have high mercury content, such as tilefish, shark, swordfish, and king mackerel. Do not eat raw (unpasteurized) dairy.  Take a prenatal vitamin to help meet your additional vitamin and mineral  needs during pregnancy, specifically for folic acid, iron, calcium, and vitamin D. This information is not intended to replace advice given to you by your health care provider. Make sure you discuss any questions you have with your health care provider. Document Revised: 01/11/2020 Document Reviewed: 01/11/2020 Elsevier Patient Education  2021 [employer].

## 2020-10-30 NOTE — Progress Notes (Signed)
Subjective:  Kathy Davenport is a 30 y.o. G1P0 at [redacted]w[redacted]d being seen today for ongoing prenatal care.  She is currently monitored for the following issues for this low-risk pregnancy and has Supervision of normal first pregnancy, antepartum; BMI 30.0-30.9,adult; Depression; ADHD (attention deficit hyperactivity disorder); Anxiety; and Constipation during pregnancy in second trimester on their problem list.  Patient reports no complaints.  Contractions: Not present. Vag. Bleeding: None.  Movement: Present. Denies leaking of fluid.   The following portions of the patient's history were reviewed and updated as appropriate: allergies, current medications, past family history, past medical history, past social history, past surgical history and problem list. Problem list updated.  Objective:   Vitals:   10/30/20 0821  BP: 110/74  Pulse: (!) 102  Weight: 208 lb (94.3 kg)    Fetal Status: Fetal Heart Rate (bpm): 150   Movement: Present     General:  Alert, oriented and cooperative. Patient is in no acute distress.  Skin: Skin is warm and dry. No rash noted.   Cardiovascular: Normal heart rate noted  Respiratory: Normal respiratory effort, no problems with respiration noted  Abdomen: Soft, gravid, appropriate for gestational age. Pain/Pressure: Present     Pelvic: Vag. Bleeding: None     Cervical exam deferred        Extremities: Normal range of motion.  Edema: Trace  Mental Status: Normal mood and affect. Normal behavior. Normal judgment and thought content.   Urinalysis:      Assessment and Plan:  Pregnancy: G1P0 at [redacted]w[redacted]d  1. Supervision of normal first pregnancy, antepartum -GTT/labs next visit  2. Anxiety -pt reports 2-3 panic attacks per week, reporting intense fear, heart racing, withdrawing from people around her, shortness of breath -pt has used hydroxyzine in the past with success -Atarax RX given for PRN use -pt currently seeing therapist weekly, not connected with  psychiatrist d/t insurance change -referral made for psychiatry -pt currently on Lexapro 20mg , just filled last RX for two months, advised we will refill meds for her until she can be seen by psychiatry -pt reports anxiety attacks are at random and is not sure what is causing them, but does notice anxiety related to social situations -pt has good support at home from partner, who regularly accompanies her to visits  3. Attention deficit hyperactivity disorder (ADHD), unspecified ADHD type -no meds  4. Recurrent major depressive disorder, remission status unspecified (HCC) -pt reports is not struggling with depressive symptoms  5. BMI 30.0-30.9,adult -on low dose ASA  6. [redacted] weeks gestation of pregnancy  Preterm labor symptoms and general obstetric precautions including but not limited to vaginal bleeding, contractions, leaking of fluid and fetal movement were reviewed in detail with the patient. I discussed the assessment and treatment plan with the patient. The patient was provided an opportunity to ask questions and all were answered. The patient agreed with the plan and demonstrated an understanding of the instructions. The patient was advised to call back or seek an in-person office evaluation/go to [facility]U at [facility] for any urgent or concerning symptoms. Please refer to After Visit Summary for other counseling recommendations.  Return in about 4 weeks (around 11/27/2020) for in-person LOB/APP OK/GTT/labs.   Tieshia Rettinger, 01/27/2021, NP

## 2020-10-30 NOTE — Progress Notes (Signed)
Pt is doing well today.

## 2020-11-27 ENCOUNTER — Other Ambulatory Visit: Payer: 59

## 2020-11-27 ENCOUNTER — Encounter: Payer: 59 | Admitting: Women's Health

## 2020-11-27 DIAGNOSIS — Z419 Encounter for procedure for purposes other than remedying health state, unspecified: Secondary | ICD-10-CM | POA: Diagnosis not present

## 2020-12-09 ENCOUNTER — Other Ambulatory Visit: Payer: 59

## 2020-12-09 ENCOUNTER — Other Ambulatory Visit: Payer: Self-pay

## 2020-12-09 ENCOUNTER — Ambulatory Visit (INDEPENDENT_AMBULATORY_CARE_PROVIDER_SITE_OTHER): Payer: 59 | Admitting: Obstetrics and Gynecology

## 2020-12-09 ENCOUNTER — Encounter: Payer: Self-pay | Admitting: Obstetrics and Gynecology

## 2020-12-09 VITALS — BP 122/75 | HR 112 | Wt 217.0 lb

## 2020-12-09 DIAGNOSIS — Z23 Encounter for immunization: Secondary | ICD-10-CM

## 2020-12-09 DIAGNOSIS — F339 Major depressive disorder, recurrent, unspecified: Secondary | ICD-10-CM

## 2020-12-09 DIAGNOSIS — F419 Anxiety disorder, unspecified: Secondary | ICD-10-CM

## 2020-12-09 DIAGNOSIS — Z3400 Encounter for supervision of normal first pregnancy, unspecified trimester: Secondary | ICD-10-CM

## 2020-12-09 MED ORDER — ESCITALOPRAM OXALATE 20 MG PO TABS: 20.0000 mg | ORAL_TABLET | Freq: Every day | ORAL | 5 refills | Status: DC

## 2020-12-09 MED ORDER — HYDROXYZINE HCL 10 MG PO TABS: 10.0000 mg | ORAL_TABLET | Freq: Three times a day (TID) | ORAL | 2 refills | Status: DC | PRN

## 2020-12-09 NOTE — Progress Notes (Signed)
Subjective:  Kathy Davenport is a 30 y.o. G1P0 at [redacted]w[redacted]d being seen today for ongoing prenatal care.  She is currently monitored for the following issues for this low-risk pregnancy and has Supervision of normal first pregnancy, antepartum; BMI 30.0-30.9,adult; Depression; ADHD (attention deficit hyperactivity disorder); and Anxiety on their problem list.  Patient reports no complaints.  Contractions: Not present. Vag. Bleeding: None.  Movement: Present. Denies leaking of fluid.   The following portions of the patient's history were reviewed and updated as appropriate: allergies, current medications, past family history, past medical history, past social history, past surgical history and problem list. Problem list updated.  Objective:   Vitals:   12/09/20 0852  BP: 122/75  Pulse: (!) 112  Weight: 217 lb (98.4 kg)    Fetal Status: Fetal Heart Rate (bpm): 160   Movement: Present     General:  Alert, oriented and cooperative. Patient is in no acute distress.  Skin: Skin is warm and dry. No rash noted.   Cardiovascular: Normal heart rate noted  Respiratory: Normal respiratory effort, no problems with respiration noted  Abdomen: Soft, gravid, appropriate for gestational age. Pain/Pressure: Present     Pelvic:  Cervical exam deferred        Extremities: Normal range of motion.  Edema: None  Mental Status: Normal mood and affect. Normal behavior. Normal judgment and thought content.   Urinalysis:      Assessment and Plan:  Pregnancy: G1P0 at [redacted]w[redacted]d  1. Supervision of normal first pregnancy, antepartum Stable Tdap and Glucola today  2. Anxiety Stable  3. Recurrent major depressive disorder, remission status unspecified (HCC) Stable  Preterm labor symptoms and general obstetric precautions including but not limited to vaginal bleeding, contractions, leaking of fluid and fetal movement were reviewed in detail with the patient. Please refer to After Visit Summary for other counseling  recommendations.  Return in about 2 weeks (around 12/23/2020) for OB visit, face to face, any provider.   Hermina Staggers, MD

## 2020-12-09 NOTE — Patient Instructions (Signed)

## 2020-12-09 NOTE — Progress Notes (Signed)
  OB/GTT.  TDAP given in RD, tolerated well. She wants to know if it is ok to travel.  PHQ-9=7 /GAD-7=7

## 2020-12-10 LAB — GLUCOSE TOLERANCE, 2 HOURS W/ 1HR
Glucose, 1 hour: 169 mg/dL (ref 65–179)
Glucose, 2 hour: 100 mg/dL (ref 65–152)
Glucose, Fasting: 81 mg/dL (ref 65–91)

## 2020-12-10 LAB — CBC
Hematocrit: 35.8 % (ref 34.0–46.6)
Hemoglobin: 11.8 g/dL (ref 11.1–15.9)
MCH: 32.2 pg (ref 26.6–33.0)
MCHC: 33 g/dL (ref 31.5–35.7)
MCV: 98 fL — ABNORMAL HIGH (ref 79–97)
Platelets: 247 10*3/uL (ref 150–450)
RBC: 3.67 x10E6/uL — ABNORMAL LOW (ref 3.77–5.28)
RDW: 12.5 % (ref 11.7–15.4)
WBC: 10.1 10*3/uL (ref 3.4–10.8)

## 2020-12-10 LAB — HIV ANTIBODY (ROUTINE TESTING W REFLEX): HIV Screen 4th Generation wRfx: NONREACTIVE

## 2020-12-10 LAB — RPR: RPR Ser Ql: NONREACTIVE

## 2020-12-23 ENCOUNTER — Encounter: Payer: 59 | Admitting: Nurse Practitioner

## 2020-12-27 DIAGNOSIS — Z419 Encounter for procedure for purposes other than remedying health state, unspecified: Secondary | ICD-10-CM | POA: Diagnosis not present

## 2020-12-31 ENCOUNTER — Other Ambulatory Visit: Payer: Self-pay | Admitting: Obstetrics and Gynecology

## 2021-01-03 ENCOUNTER — Encounter: Payer: 59 | Admitting: Nurse Practitioner

## 2021-01-06 ENCOUNTER — Other Ambulatory Visit: Payer: Self-pay

## 2021-01-06 ENCOUNTER — Encounter: Payer: Self-pay | Admitting: Advanced Practice Midwife

## 2021-01-06 ENCOUNTER — Ambulatory Visit (INDEPENDENT_AMBULATORY_CARE_PROVIDER_SITE_OTHER): Payer: 59 | Admitting: Advanced Practice Midwife

## 2021-01-06 VITALS — BP 114/79 | HR 97 | Wt 229.0 lb

## 2021-01-06 DIAGNOSIS — Z3A33 33 weeks gestation of pregnancy: Secondary | ICD-10-CM

## 2021-01-06 DIAGNOSIS — Z3400 Encounter for supervision of normal first pregnancy, unspecified trimester: Secondary | ICD-10-CM

## 2021-01-06 DIAGNOSIS — F419 Anxiety disorder, unspecified: Secondary | ICD-10-CM

## 2021-01-06 DIAGNOSIS — F339 Major depressive disorder, recurrent, unspecified: Secondary | ICD-10-CM

## 2021-01-06 DIAGNOSIS — O99340 Other mental disorders complicating pregnancy, unspecified trimester: Secondary | ICD-10-CM

## 2021-01-06 MED ORDER — ESCITALOPRAM OXALATE 20 MG PO TABS: 20.0000 mg | ORAL_TABLET | Freq: Every day | ORAL | 5 refills | Status: DC

## 2021-01-06 MED ORDER — HYDROXYZINE PAMOATE 25 MG PO CAPS: 25.0000 mg | ORAL_CAPSULE | Freq: Three times a day (TID) | ORAL | 5 refills | Status: DC | PRN

## 2021-01-06 NOTE — Progress Notes (Signed)
Pt recently recovered from covered tested  +positive on  12/20/20.

## 2021-01-06 NOTE — Progress Notes (Signed)
   PRENATAL VISIT NOTE  Subjective:  Kathy Davenport is a 30 y.o. G1P0 at [redacted]w[redacted]d being seen today for ongoing prenatal care.  She is currently monitored for the following issues for this low-risk pregnancy and has Supervision of normal first pregnancy, antepartum; BMI 30.0-30.9,adult; Depression; ADHD (attention deficit hyperactivity disorder); and Anxiety on their problem list.  Patient reports occasional contractions.  Contractions: Irritability. Vag. Bleeding: None.  Movement: Present. Denies leaking of fluid.   The following portions of the patient's history were reviewed and updated as appropriate: allergies, current medications, past family history, past medical history, past social history, past surgical history and problem list.   Objective:   Vitals:   01/06/21 0859  BP: 114/79  Pulse: 97  Weight: 229 lb (103.9 kg)    Fetal Status: Fetal Heart Rate (bpm): 140   Movement: Present     General:  Alert, oriented and cooperative. Patient is in no acute distress.  Skin: Skin is warm and dry. No rash noted.   Cardiovascular: Normal heart rate noted  Respiratory: Normal respiratory effort, no problems with respiration noted  Abdomen: Soft, gravid, appropriate for gestational age.  Pain/Pressure: Present     Pelvic: Cervical exam deferred        Extremities: Normal range of motion.  Edema: Trace  Mental Status: Normal mood and affect. Normal behavior. Normal judgment and thought content.   Assessment and Plan:  Pregnancy: G1P0 at [redacted]w[redacted]d 1. Supervision of normal first pregnancy, antepartum --Anticipatory guidance about next visits/weeks of pregnancy given. --Next visit in 2 weeks  2. [redacted] weeks gestation of pregnancy   3. Recurrent major depressive disorder, remission status unspecified (HCC) --Doing well, has counselor she sees weekly  --Renewed Rx for Lexapro, which pt has taken for 3 years  4. Anxiety disorder affecting pregnancy, antepartum --Doing well overall. Hydroxyzine  helps but asked to increase dose --Rx for Vistaril 25 mg TID PRN  Preterm labor symptoms and general obstetric precautions including but not limited to vaginal bleeding, contractions, leaking of fluid and fetal movement were reviewed in detail with the patient. Please refer to After Visit Summary for other counseling recommendations.   Return in about 2 weeks (around 01/20/2021).  Future Appointments  Date Time Provider Department Center  01/20/2021  9:55 AM Leftwich-Kirby, Wilmer Floor, CNM [facility] None     Sharen Counter, CNM

## 2021-01-06 NOTE — Patient Instructions (Signed)
AREA FAMILY PRACTICE PHYSICIANS  [place] ([id number]) [facility] [address] [place], [place] [address] [phone] Mon-Fri 8:30-12:30, 1:30-5:00 Accepting Medicaid [facility] [address], [place], [place] [address] [phone] Mon-Fri 8:00-5:30 [facility] [address] [place], [place] [address] [phone] Mon, Tue, Thur, Fri 8:30-5:00, Wed 10:00-7:00 (closed 1-2pm) Accepting Medicaid [facility] [address], [place], [place]  [address] Phone - [phone]   Fax - [phone]  [place] ([address]) [facility] [address] [place], [place] [address] [phone] Mon-Fri 8:00-5:00 [facility] ([facility])  [address] [place], [place] [address] [phone] Mon-Fri 8:30-5:30, Sat (Oct.-Mar.) 9:00-1:00 Accepting Medicaid  [place] ([address]) [facility] [address], [place], [place] [address] [phone] Mon-Fri 8:00-5:00  [place] ([address]) [facility] [address], [place], [place] [address] [phone] Mon-Fri 8:00-5:00 [facility] [address], [place], [place] [address] [phone] Mon-Fri 8:00-5:00 [facility] [place] [facility] [address] [place], [place] [address] [phone] Mon-Fri 8:00-5:00 [facility] [address] [place] [address] [phone] Mon-Fri 7:30-5:30  [place] ([address] & [address]) [facility] [address] [place], [place] [address] [phone] Mon-Thur 8:00-6:00 Accepting Medicaid [facility] [address] [place], [place] [address] [phone] Mon-Thur 7:30-7:30, Fri 7:30-4:30 Accepting  Medicaid [facility] [address], [place], [place]  [address] [phone]   Fax - [phone]  [place] ([address] & [address]) [facility] [address] [place], [place] [address] [phone] Mon-Fri 7:00-5:00 [facility] [address], [place], [place] [address] [phone] Mon-Fri 8:00-5:00 Accepting Medicaid [facility] [address], , [place] [address] [phone] Mon-Fri 8:00-5:00 Accepting Medicaid  [place] ([address]) [facility] [place] [address] [place], [place] [address] [phone] Mon-Fri 8:00-5:00 [facility] [address], [place], [place] [address] [phone] Mon-Fri 8:00-5:00 Accepting Medicaid [facility] ([facility]) [address], [place], [place] [address] [phone] Mon-Fri 8:00-5:30, Sat&Sun by appointment (phones open at 8:30) Accepting Medicaid  [place] ([address]) [facility] [address] [place], [place] [address] [phone] Mon-Thur 8:00-7:00, Fri 8:00-5:00, Sat 8:00-12:00, Sun 9:00-12:00 Accepting Medicaid [facility] [place] [address], [place], [place] [address] [phone] Mon-Thur 8:00-5:00 Accepting Medicaid [facility] [address] [place], [place] [address] [phone] Mon-Fri 8:00-5:30, Sat (Oct.-Mar.) 9:00-1:00 Accepting Medicaid  [place] ([address]) [facility] [address], [place], [place] [address] [phone] Mon-Fri 8:00-5:00 Accepting Medicaid   [place] ([address]) [facility] [place] [address], [place], [place] [address] [phone] Mon-Fri 8:00-5:00 [facility] [place] [address], [place], [place] [address] [phone] Mon-Fri 8:00-5:00 [facility] [place] [address] [place] [address], [place],  [address] [phone] Mon-Fri 8:00-5:00 After hours clinic ([address] [place],  [address]) [phone] Mon-Fri 5:00-8:00, Sat 12:00-6:00, Sun 10:00-4:00 Accepting Medicaid [facility] [place]   [address], [place], [place]  [address] [phone]   Fax - [phone]  [place] ([address]) [facility] [place] [address], [place], [place] [address] [phone] Mon-Fri 8:00-5:00 [facility] [place] ([facility] [place]) [address], [place], [place] [address] [phone] Mon-Thur 8:00-7:00, Fri 8:00-5:00, Sat 8:00-12:00    

## 2021-01-20 ENCOUNTER — Ambulatory Visit (INDEPENDENT_AMBULATORY_CARE_PROVIDER_SITE_OTHER): Payer: 59 | Admitting: Advanced Practice Midwife

## 2021-01-20 ENCOUNTER — Other Ambulatory Visit: Payer: Self-pay

## 2021-01-20 VITALS — BP 132/88 | HR 82 | Wt 235.0 lb

## 2021-01-20 DIAGNOSIS — O479 False labor, unspecified: Secondary | ICD-10-CM

## 2021-01-20 DIAGNOSIS — F339 Major depressive disorder, recurrent, unspecified: Secondary | ICD-10-CM

## 2021-01-20 DIAGNOSIS — F419 Anxiety disorder, unspecified: Secondary | ICD-10-CM

## 2021-01-20 DIAGNOSIS — Z3A35 35 weeks gestation of pregnancy: Secondary | ICD-10-CM

## 2021-01-20 DIAGNOSIS — Z3400 Encounter for supervision of normal first pregnancy, unspecified trimester: Secondary | ICD-10-CM

## 2021-01-20 DIAGNOSIS — O99340 Other mental disorders complicating pregnancy, unspecified trimester: Secondary | ICD-10-CM

## 2021-01-20 NOTE — Progress Notes (Signed)
   PRENATAL VISIT NOTE  Subjective:  Kathy Davenport is a 30 y.o. G1P0 at [redacted]w[redacted]d being seen today for ongoing prenatal care.  She is currently monitored for the following issues for this low-risk pregnancy and has Supervision of normal first pregnancy, antepartum; BMI 30.0-30.9,adult; Depression; ADHD (attention deficit hyperactivity disorder); and Anxiety on their problem list.  Patient reports occasional contractions.  Contractions: Not present. Vag. Bleeding: None.  Movement: Present. Denies leaking of fluid.   The following portions of the patient's history were reviewed and updated as appropriate: allergies, current medications, past family history, past medical history, past social history, past surgical history and problem list.   Objective:   Vitals:   01/20/21 1009  BP: 132/88  Pulse: 82  Weight: 235 lb (106.6 kg)    Fetal Status: Fetal Heart Rate (bpm): 162 Fundal Height: 36 cm Movement: Present  Presentation: Vertex  General:  Alert, oriented and cooperative. Patient is in no acute distress.  Skin: Skin is warm and dry. No rash noted.   Cardiovascular: Normal heart rate noted  Respiratory: Normal respiratory effort, no problems with respiration noted  Abdomen: Soft, gravid, appropriate for gestational age.  Pain/Pressure: Present     Pelvic: Cervical exam performed in the presence of a chaperone Dilation: Fingertip Effacement (%): 0 Station: -3  Extremities: Normal range of motion.  Edema: Trace  Mental Status: Normal mood and affect. Normal behavior. Normal judgment and thought content.   Assessment and Plan:  Pregnancy: G1P0 at [redacted]w[redacted]d 1. Supervision of normal first pregnancy, antepartum --Anticipatory guidance about next visits/weeks of pregnancy given. --Next visit in 2 weeks for GBS  2. [redacted] weeks gestation of pregnancy   3. Recurrent major depressive disorder, remission status unspecified (HCC) --On Lexapro x 3 years, has regular counseling established, reports  doing well  4. Anxiety disorder affecting pregnancy, antepartum --Increased Vistaril to 25 mg TID PRN last visit  5. Braxton Hicks contractions --Irregular but painful contractions, sometimes keeping pt awake at night, worsening yesterday --Cervix 0.5 cm and long, no signs of PTL --Labor precautions reviewed   Preterm labor symptoms and general obstetric precautions including but not limited to vaginal bleeding, contractions, leaking of fluid and fetal movement were reviewed in detail with the patient. Please refer to After Visit Summary for other counseling recommendations.   Return in about 2 weeks (around 02/03/2021).  Future Appointments  Date Time Provider Department Center  02/03/2021  2:30 PM Leftwich-Kirby, Wilmer Floor, CNM [facility] None    Sharen Counter, CNM

## 2021-01-27 DIAGNOSIS — Z419 Encounter for procedure for purposes other than remedying health state, unspecified: Secondary | ICD-10-CM | POA: Diagnosis not present

## 2021-02-03 ENCOUNTER — Other Ambulatory Visit (HOSPITAL_COMMUNITY)
Admission: RE | Admit: 2021-02-03 | Discharge: 2021-02-03 | Disposition: A | Discharge status: Home / Self Care | Payer: 59 | Source: Ambulatory Visit | Attending: Advanced Practice Midwife | Admitting: Advanced Practice Midwife

## 2021-02-03 ENCOUNTER — Other Ambulatory Visit: Payer: Self-pay

## 2021-02-03 ENCOUNTER — Ambulatory Visit (INDEPENDENT_AMBULATORY_CARE_PROVIDER_SITE_OTHER): Payer: 59 | Admitting: Advanced Practice Midwife

## 2021-02-03 VITALS — BP 131/85 | HR 104 | Wt 241.4 lb

## 2021-02-03 DIAGNOSIS — Z3A37 37 weeks gestation of pregnancy: Secondary | ICD-10-CM

## 2021-02-03 DIAGNOSIS — O26893 Other specified pregnancy related conditions, third trimester: Secondary | ICD-10-CM

## 2021-02-03 DIAGNOSIS — F419 Anxiety disorder, unspecified: Secondary | ICD-10-CM

## 2021-02-03 DIAGNOSIS — Z3400 Encounter for supervision of normal first pregnancy, unspecified trimester: Secondary | ICD-10-CM | POA: Diagnosis present

## 2021-02-03 DIAGNOSIS — F339 Major depressive disorder, recurrent, unspecified: Secondary | ICD-10-CM

## 2021-02-03 DIAGNOSIS — R519 Headache, unspecified: Secondary | ICD-10-CM

## 2021-02-03 DIAGNOSIS — O99340 Other mental disorders complicating pregnancy, unspecified trimester: Secondary | ICD-10-CM

## 2021-02-03 NOTE — Patient Instructions (Signed)
Things to Try After 37 weeks to Encourage Labor/Get Ready for Labor:    Try the Miles Circuit at www.milescircuit.com daily to improve baby's position and encourage the onset of labor.  Walk a little and rest a little every day.  Change positions often.  Cervical Ripening: May try one or both Red Raspberry Leaf capsules or tea:  two 300mg or 400mg tablets with each meal, 2-3 times a day, or 1-3 cups of tea daily  Potential Side Effects Of Raspberry Leaf:  Most women do not experience any side effects from drinking raspberry leaf tea. However, nausea and loose stools are possible   Evening Primrose Oil capsules: take 1 capsule by mouth and place one capsule in the vagina every night.    Some of the potential side effects:  Upset stomach  Loose stools or diarrhea  Headaches  Nausea  Sex can also help the cervix ripen and encourage labor onset.    Labor Precautions Reasons to come to MAU at [facility]:  1.  Contractions are  5 minutes apart or less, each last 1 minute, these have been going on for 1-2 hours, and you cannot walk or talk during them 2.  You have a large gush of fluid, or a trickle of fluid that will not stop and you have to wear a pad 3.  You have bleeding that is bright red, heavier than spotting--like menstrual bleeding (spotting can be normal in early labor or after a check of your cervix) 4.  You do not feel the baby moving like he/she normally does  

## 2021-02-03 NOTE — Progress Notes (Signed)
Reports headache, denies blurry vision, dizziness. RUQ "the other day".

## 2021-02-03 NOTE — Progress Notes (Signed)
   PRENATAL VISIT NOTE  Subjective:  Kathy Davenport is a 30 y.o. G1P0 at [redacted]w[redacted]d being seen today for ongoing prenatal care.  She is currently monitored for the following issues for this low-risk pregnancy and has Supervision of normal first pregnancy, antepartum; BMI 30.0-30.9,adult; Depression; ADHD (attention deficit hyperactivity disorder); and Anxiety on their problem list.  Patient reports occasional contractions.  Contractions: Irritability. Vag. Bleeding: None.  Movement: Present. Denies leaking of fluid.   The following portions of the patient's history were reviewed and updated as appropriate: allergies, current medications, past family history, past medical history, past social history, past surgical history and problem list.   Objective:   Vitals:   02/03/21 1447  BP: 131/85  Pulse: (!) 104  Weight: 241 lb 6.4 oz (109.5 kg)    Fetal Status: Fetal Heart Rate (bpm): 132 Fundal Height: 37 cm Movement: Present  Presentation: Vertex  General:  Alert, oriented and cooperative. Patient is in no acute distress.  Skin: Skin is warm and dry. No rash noted.   Cardiovascular: Normal heart rate noted  Respiratory: Normal respiratory effort, no problems with respiration noted  Abdomen: Soft, gravid, appropriate for gestational age.  Pain/Pressure: Present     Pelvic: Cervical exam performed in the presence of a chaperone Dilation: Fingertip Effacement (%): 0 Station: -3  Extremities: Normal range of motion.  Edema: Trace  Mental Status: Normal mood and affect. Normal behavior. Normal judgment and thought content.   Assessment and Plan:  Pregnancy: G1P0 at [redacted]w[redacted]d 1. Supervision of normal first pregnancy, antepartum --Anticipatory guidance about next visits/weeks of pregnancy given. --Next visit in 1 week  - Strep Gp B Culture+Rflx - Cervicovaginal ancillary only( [place])  2. [redacted] weeks gestation of pregnancy   3. Recurrent major depressive disorder, remission status  unspecified (HCC) --Well managed on Lexapro x 3 years  4. Headache in pregnancy, antepartum, third trimester --Resolves with rest/fluids/Tylenol --Take BP at home, f/u if elevated --Continue to watch --Reviewed s/sx of PEC  5. Anxiety disorder affecting pregnancy, antepartum --Managed well with Vistaril  Term labor symptoms and general obstetric precautions including but not limited to vaginal bleeding, contractions, leaking of fluid and fetal movement were reviewed in detail with the patient. Please refer to After Visit Summary for other counseling recommendations.   Return in about 1 week (around 02/10/2021).  Future Appointments  Date Time Provider Department Center  02/11/2021  3:50 PM Leftwich-Kirby, Wilmer Floor, CNM [facility] None    Sharen Counter, CNM

## 2021-02-04 LAB — CERVICOVAGINAL ANCILLARY ONLY
Chlamydia: NEGATIVE
Comment: NEGATIVE
Comment: NORMAL
Neisseria Gonorrhea: NEGATIVE

## 2021-02-07 LAB — STREP GP B CULTURE+RFLX: Strep Gp B Culture+Rflx: NEGATIVE

## 2021-02-11 ENCOUNTER — Ambulatory Visit (INDEPENDENT_AMBULATORY_CARE_PROVIDER_SITE_OTHER): Payer: 59 | Admitting: Advanced Practice Midwife

## 2021-02-11 ENCOUNTER — Other Ambulatory Visit: Payer: Self-pay

## 2021-02-11 VITALS — BP 127/83 | HR 83 | Wt 244.0 lb

## 2021-02-11 DIAGNOSIS — Z3A38 38 weeks gestation of pregnancy: Secondary | ICD-10-CM

## 2021-02-11 DIAGNOSIS — Z3400 Encounter for supervision of normal first pregnancy, unspecified trimester: Secondary | ICD-10-CM

## 2021-02-11 NOTE — Progress Notes (Signed)
   PRENATAL VISIT NOTE  Subjective:  Kathy Davenport is a 30 y.o. G1P0 at [redacted]w[redacted]d being seen today for ongoing prenatal care.  She is currently monitored for the following issues for this low-risk pregnancy and has Supervision of normal first pregnancy, antepartum; BMI 30.0-30.9,adult; Depression; ADHD (attention deficit hyperactivity disorder); and Anxiety on their problem list.  Patient reports occasional contractions.  Contractions: Irregular. Vag. Bleeding: None.  Movement: Present. Denies leaking of fluid.   The following portions of the patient's history were reviewed and updated as appropriate: allergies, current medications, past family history, past medical history, past social history, past surgical history and problem list.   Objective:   Vitals:   02/11/21 1601  BP: 127/83  Pulse: 83  Weight: 244 lb (110.7 kg)    Fetal Status: Fetal Heart Rate (bpm): 135 Fundal Height: 38 cm Movement: Present  Presentation: Vertex  General:  Alert, oriented and cooperative. Patient is in no acute distress.  Skin: Skin is warm and dry. No rash noted.   Cardiovascular: Normal heart rate noted  Respiratory: Normal respiratory effort, no problems with respiration noted  Abdomen: Soft, gravid, appropriate for gestational age.  Pain/Pressure: Present     Pelvic: Cervical exam performed in the presence of a chaperone Dilation: 1 Effacement (%): 40 Station: -3  Extremities: Normal range of motion.  Edema: Trace  Mental Status: Normal mood and affect. Normal behavior. Normal judgment and thought content.   Assessment and Plan:  Pregnancy: G1P0 at [redacted]w[redacted]d 1. Supervision of normal first pregnancy, antepartum --Anticipatory guidance about next visits/weeks of pregnancy given. --Questions answered about visitors, pediatrician, hospital stay etc. --Next visit in 1 week  2. [redacted] weeks gestation of pregnancy   Term labor symptoms and general obstetric precautions including but not limited to vaginal  bleeding, contractions, leaking of fluid and fetal movement were reviewed in detail with the patient. Please refer to After Visit Summary for other counseling recommendations.   No follow-ups on file.  Future Appointments  Date Time Provider Department Center  02/21/2021  8:35 AM Rasch, Harolyn Rutherford, NP [facility] None    Sharen Counter, CNM

## 2021-02-13 ENCOUNTER — Encounter (HOSPITAL_COMMUNITY): Payer: Self-pay | Admitting: Family Medicine

## 2021-02-13 ENCOUNTER — Other Ambulatory Visit: Payer: Self-pay

## 2021-02-13 ENCOUNTER — Inpatient Hospital Stay (HOSPITAL_COMMUNITY)
Admission: AD | Admit: 2021-02-13 | Discharge: 2021-02-16 | DRG: 807 | Disposition: A | Discharge status: Home / Self Care | Payer: 59 | Attending: Obstetrics & Gynecology | Admitting: Obstetrics & Gynecology

## 2021-02-13 DIAGNOSIS — Z87891 Personal history of nicotine dependence: Secondary | ICD-10-CM

## 2021-02-13 DIAGNOSIS — O139 Gestational [pregnancy-induced] hypertension without significant proteinuria, unspecified trimester: Secondary | ICD-10-CM | POA: Diagnosis present

## 2021-02-13 DIAGNOSIS — O134 Gestational [pregnancy-induced] hypertension without significant proteinuria, complicating childbirth: Principal | ICD-10-CM | POA: Diagnosis present

## 2021-02-13 DIAGNOSIS — O99214 Obesity complicating childbirth: Secondary | ICD-10-CM | POA: Diagnosis present

## 2021-02-13 DIAGNOSIS — O9952 Diseases of the respiratory system complicating childbirth: Secondary | ICD-10-CM | POA: Diagnosis present

## 2021-02-13 DIAGNOSIS — Z3A39 39 weeks gestation of pregnancy: Secondary | ICD-10-CM

## 2021-02-13 DIAGNOSIS — O26893 Other specified pregnancy related conditions, third trimester: Secondary | ICD-10-CM | POA: Diagnosis not present

## 2021-02-13 DIAGNOSIS — J45909 Unspecified asthma, uncomplicated: Secondary | ICD-10-CM | POA: Diagnosis present

## 2021-02-13 NOTE — MAU Note (Signed)
Pt having irr ctx 3-10 minutes apart. Having cramping with back pain. No bleeding or LOF. +FM

## 2021-02-14 ENCOUNTER — Inpatient Hospital Stay (HOSPITAL_COMMUNITY): Payer: 59 | Admitting: Anesthesiology

## 2021-02-14 ENCOUNTER — Encounter (HOSPITAL_COMMUNITY): Payer: Self-pay | Admitting: Family Medicine

## 2021-02-14 DIAGNOSIS — Z3A39 39 weeks gestation of pregnancy: Secondary | ICD-10-CM

## 2021-02-14 DIAGNOSIS — J45909 Unspecified asthma, uncomplicated: Secondary | ICD-10-CM | POA: Diagnosis present

## 2021-02-14 DIAGNOSIS — O99214 Obesity complicating childbirth: Secondary | ICD-10-CM | POA: Diagnosis present

## 2021-02-14 DIAGNOSIS — O133 Gestational [pregnancy-induced] hypertension without significant proteinuria, third trimester: Secondary | ICD-10-CM

## 2021-02-14 DIAGNOSIS — O26893 Other specified pregnancy related conditions, third trimester: Secondary | ICD-10-CM | POA: Diagnosis present

## 2021-02-14 DIAGNOSIS — O9952 Diseases of the respiratory system complicating childbirth: Secondary | ICD-10-CM | POA: Diagnosis present

## 2021-02-14 DIAGNOSIS — O134 Gestational [pregnancy-induced] hypertension without significant proteinuria, complicating childbirth: Secondary | ICD-10-CM

## 2021-02-14 DIAGNOSIS — O139 Gestational [pregnancy-induced] hypertension without significant proteinuria, unspecified trimester: Secondary | ICD-10-CM | POA: Diagnosis present

## 2021-02-14 DIAGNOSIS — Z87891 Personal history of nicotine dependence: Secondary | ICD-10-CM | POA: Diagnosis not present

## 2021-02-14 LAB — COMPREHENSIVE METABOLIC PANEL
ALT: 13 U/L (ref 0–44)
AST: 16 U/L (ref 15–41)
Albumin: 2.6 g/dL — ABNORMAL LOW (ref 3.5–5.0)
Alkaline Phosphatase: 97 U/L (ref 38–126)
Anion gap: 10 (ref 5–15)
BUN: 9 mg/dL (ref 6–20)
CO2: 21 mmol/L — ABNORMAL LOW (ref 22–32)
Calcium: 8.8 mg/dL — ABNORMAL LOW (ref 8.9–10.3)
Chloride: 104 mmol/L (ref 98–111)
Creatinine, Ser: 0.64 mg/dL (ref 0.44–1.00)
GFR, Estimated: >60 mL/min (ref >60)
Glucose, Bld: 88 mg/dL (ref 70–99)
Potassium: 4 mmol/L (ref 3.5–5.1)
Sodium: 135 mmol/L (ref 135–145)
Total Bilirubin: 0.4 mg/dL (ref 0.3–1.2)
Total Protein: 6.1 g/dL — ABNORMAL LOW (ref 6.5–8.1)

## 2021-02-14 LAB — PROTEIN / CREATININE RATIO, URINE
Creatinine, Urine: 80.22 mg/dL
Protein Creatinine Ratio: 0.07 mg/mg{Cre} (ref 0.00–0.15)
Total Protein, Urine: 6 mg/dL

## 2021-02-14 LAB — CBC
HCT: 33.7 % — ABNORMAL LOW (ref 36.0–46.0)
Hemoglobin: 11 g/dL — ABNORMAL LOW (ref 12.0–15.0)
MCH: 30.6 pg (ref 26.0–34.0)
MCHC: 32.6 g/dL (ref 30.0–36.0)
MCV: 93.6 fL (ref 80.0–100.0)
Platelets: 251 10*3/uL (ref 150–400)
RBC: 3.6 MIL/uL — ABNORMAL LOW (ref 3.87–5.11)
RDW: 14.2 % (ref 11.5–15.5)
WBC: 10.7 10*3/uL — ABNORMAL HIGH (ref 4.0–10.5)
nRBC: 0 % (ref 0.0–0.2)

## 2021-02-14 LAB — RPR: RPR Ser Ql: NONREACTIVE

## 2021-02-14 LAB — TYPE AND SCREEN
ABO/RH(D): A POS
Antibody Screen: NEGATIVE

## 2021-02-14 MED ORDER — DIPHENHYDRAMINE HCL 50 MG/ML IJ SOLN
12.5000 mg | INTRAMUSCULAR | Status: DC | PRN
  Administered 2021-02-14: 12.5 mg via INTRAVENOUS
  Filled 2021-02-14: qty 1

## 2021-02-14 MED ORDER — PHENYLEPHRINE 40 MCG/ML (10ML) SYRINGE FOR IV PUSH (FOR BLOOD PRESSURE SUPPORT): 80.0000 ug | PREFILLED_SYRINGE | INTRAVENOUS | Status: DC | PRN

## 2021-02-14 MED ORDER — HYDROXYZINE HCL 25 MG PO TABS: 25.0000 mg | ORAL_TABLET | Freq: Three times a day (TID) | ORAL | Status: DC | PRN

## 2021-02-14 MED ORDER — FENTANYL-BUPIVACAINE-NACL 0.5-0.125-0.9 MG/250ML-% EP SOLN
12.0000 mL/h | EPIDURAL | Status: DC | PRN
  Administered 2021-02-14 (×3): 12 mL/h via EPIDURAL
  Filled 2021-02-14 (×2): qty 250

## 2021-02-14 MED ORDER — EPHEDRINE 5 MG/ML INJ: 10.0000 mg | INTRAVENOUS | Status: DC | PRN

## 2021-02-14 MED ORDER — LIDOCAINE-EPINEPHRINE (PF) 2 %-1:200000 IJ SOLN
INTRAMUSCULAR | Status: DC | PRN
  Administered 2021-02-14: 5 mL via EPIDURAL

## 2021-02-14 MED ORDER — LACTATED RINGERS IV SOLN
500.0000 mL | Freq: Once | INTRAVENOUS | Status: AC
  Administered 2021-02-14: 500 mL via INTRAVENOUS

## 2021-02-14 MED ORDER — TERBUTALINE SULFATE 1 MG/ML IJ SOLN: 0.2500 mg | Freq: Once | INTRAMUSCULAR | Status: DC | PRN

## 2021-02-14 MED ORDER — OXYCODONE-ACETAMINOPHEN 5-325 MG PO TABS: 2.0000 | ORAL_TABLET | ORAL | Status: DC | PRN

## 2021-02-14 MED ORDER — ACETAMINOPHEN 325 MG PO TABS: 650.0000 mg | ORAL_TABLET | ORAL | Status: DC | PRN

## 2021-02-14 MED ORDER — OXYTOCIN BOLUS FROM INFUSION
333.0000 mL | Freq: Once | INTRAVENOUS | Status: AC
  Administered 2021-02-14: 333 mL via INTRAVENOUS

## 2021-02-14 MED ORDER — OXYCODONE-ACETAMINOPHEN 5-325 MG PO TABS: 1.0000 | ORAL_TABLET | ORAL | Status: DC | PRN

## 2021-02-14 MED ORDER — OXYTOCIN-SODIUM CHLORIDE 30-0.9 UT/500ML-% IV SOLN
1.0000 m[IU]/min | INTRAVENOUS | Status: DC
  Administered 2021-02-14: 2 m[IU]/min via INTRAVENOUS
  Filled 2021-02-14: qty 500

## 2021-02-14 MED ORDER — FENTANYL CITRATE (PF) 100 MCG/2ML IJ SOLN
100.0000 ug | INTRAMUSCULAR | Status: DC | PRN
  Administered 2021-02-14: 100 ug via INTRAVENOUS
  Filled 2021-02-14: qty 2

## 2021-02-14 MED ORDER — SOD CITRATE-CITRIC ACID 500-334 MG/5ML PO SOLN: 30.0000 mL | ORAL | Status: DC | PRN

## 2021-02-14 MED ORDER — LIDOCAINE HCL (PF) 1 % IJ SOLN
30.0000 mL | INTRAMUSCULAR | Status: DC | PRN
Start: 2021-02-14 — End: 2021-02-15

## 2021-02-14 MED ORDER — OXYTOCIN-SODIUM CHLORIDE 30-0.9 UT/500ML-% IV SOLN
2.5000 [IU]/h | INTRAVENOUS | Status: DC
  Filled 2021-02-14: qty 500

## 2021-02-14 MED ORDER — LACTATED RINGERS IV SOLN: INTRAVENOUS | Status: DC

## 2021-02-14 MED ORDER — LACTATED RINGERS IV SOLN: 500.0000 mL | INTRAVENOUS | Status: DC | PRN

## 2021-02-14 MED ORDER — ONDANSETRON HCL 4 MG/2ML IJ SOLN
4.0000 mg | Freq: Four times a day (QID) | INTRAMUSCULAR | Status: DC | PRN
Start: 2021-02-14 — End: 2021-02-15
  Administered 2021-02-14: 4 mg via INTRAVENOUS
  Filled 2021-02-14: qty 2

## 2021-02-14 NOTE — Discharge Summary (Addendum)
Postpartum Discharge Summary     Patient Name: Kathy Davenport DOB: Jul 26, 1990 MRN: [id number]  Date of admission: 02/13/2021 Delivery date:02/14/2021  Delivering provider: Laury Deep  Date of discharge: 02/16/2021  Admitting diagnosis: Gestational hypertension [O13.9] Intrauterine pregnancy: [redacted]w[redacted]d    Secondary diagnosis:  Active Problems:   Gestational hypertension  Additional problems: none    Discharge diagnosis: Term Pregnancy Delivered and Gestational Hypertension                                              Post partum procedures: n/a Augmentation: AROM and Pitocin Complications: None  Hospital course: Induction of Labor With Vaginal Delivery   30y.o. yo G1P0 at 381w0das admitted to the hospital 02/13/2021 for induction of labor.  Indication for induction: Gestational hypertension.  Patient had an uncomplicated labor course as follows: Membrane Rupture Time/Date: 12:30 PM ,02/14/2021   Delivery Method:Vaginal, Spontaneous  Episiotomy: None  Lacerations:  1st degree;Vaginal  Details of delivery can be found in separate delivery note.  Patient had a routine postpartum course. Patient is discharged home 02/16/21.  Newborn Data: Birth date:02/14/2021  Birth time:9:49 PM  Gender:Female  Living status:Living  Apgars:8 ,9  Weight:3325 g   Magnesium Sulfate received: No BMZ received: No Rhophylac:N/A MMR:N/A T-DaP:Given prenatally Flu: Yes Transfusion:No  Physical exam  Vitals:   02/15/21 0911 02/15/21 1338 02/15/21 1931 02/16/21 0612  BP: 117/82 124/74 131/78 (!) 132/91  Pulse: 89 98 97 (!) 113  Resp: 18 18 18 18   Temp: 98.4 F (36.9 C) 98.4 F (36.9 C) 98.7 F (37.1 C) 98.5 F (36.9 C)  TempSrc: Oral Oral Oral Oral  SpO2: 99%     Weight:      Height:       General: alert, cooperative, and no distress Lochia: appropriate Uterine Fundus: firm Incision: N/A DVT Evaluation: No evidence of DVT seen on physical exam. Negative Homan's sign. No cords or  calf tenderness. No significant calf/ankle edema.  Labs: Lab Results  Component Value Date   WBC 10.7 (H) 02/13/2021   HGB 11.0 (L) 02/13/2021   HCT 33.7 (L) 02/13/2021   MCV 93.6 02/13/2021   PLT 251 02/13/2021   CMP Latest Ref Rng & Units 02/13/2021  Glucose 70 - 99 mg/dL 88  BUN 6 - 20 mg/dL 9  Creatinine 0.44 - 1.00 mg/dL 0.64  Sodium 135 - 145 mmol/L 135  Potassium 3.5 - 5.1 mmol/L 4.0  Chloride 98 - 111 mmol/L 104  CO2 22 - 32 mmol/L 21(L)  Calcium 8.9 - 10.3 mg/dL 8.8(L)  Total Protein 6.5 - 8.1 g/dL 6.1(L)  Total Bilirubin 0.3 - 1.2 mg/dL 0.4  Alkaline Phos 38 - 126 U/L 97  AST 15 - 41 U/L 16  ALT 0 - 44 U/L 13   Edinburgh Score: Edinburgh Postnatal Depression Scale Screening Tool 02/16/2021  I have been able to laugh and see the funny side of things. 0  I have looked forward with enjoyment to things. 0  I have blamed myself unnecessarily when things went wrong. 2  I have been anxious or worried for no good reason. 2  I have felt scared or panicky for no good reason. 2  Things have been getting on top of me. 1  I have been so unhappy that I have had difficulty sleeping. 0  I have felt sad or  miserable. 0  I have been so unhappy that I have been crying. 0  The thought of harming myself has occurred to me. 0  Edinburgh Postnatal Depression Scale Total 7     After visit meds:  Allergies as of 02/16/2021       Reactions   Penicillins Hives        Medication List     TAKE these medications    acetaminophen 325 MG tablet Commonly known as: Tylenol Take 2 tablets (650 mg total) by mouth every 4 (four) hours as needed (for pain scale < 4). What changed:  when to take this reasons to take this   albuterol (2.5 MG/3ML) 0.083% nebulizer solution Commonly known as: PROVENTIL Take 2.5 mg by nebulization every 6 (six) hours as needed for wheezing or shortness of breath.   aspirin EC 81 MG tablet Take 1 tablet (81 mg total) by mouth daily. Take after 12  weeks for prevention of preeclampsia later in pregnancy   Blood Pressure Kit Devi 1 kit by Does not apply route as needed. Large   diphenhydrAMINE 25 MG tablet Commonly known as: BENADRYL Take 25 mg by mouth every 6 (six) hours as needed.   doxylamine (Sleep) 25 MG tablet Commonly known as: UNISOM Take 25 mg by mouth at bedtime as needed.   escitalopram 20 MG tablet Commonly known as: LEXAPRO Take 1 tablet (20 mg total) by mouth daily.   hydrOXYzine 25 MG capsule Commonly known as: Vistaril Take 1 capsule (25 mg total) by mouth 3 (three) times daily as needed.   ibuprofen 600 MG tablet Commonly known as: ADVIL Take 1 tablet (600 mg total) by mouth every 6 (six) hours.   NIFEdipine 30 MG 24 hr tablet Commonly known as: Procardia XL Take 1 tablet (30 mg total) by mouth daily.   norethindrone 0.35 MG tablet Commonly known as: Ortho Micronor Take 1 tablet (0.35 mg total) by mouth daily.   prenatal multivitamin Tabs tablet Take 1 tablet by mouth daily at 12 noon.       Discharge home in stable condition Infant Feeding:  both Infant Disposition:home with mother Discharge instruction: per After Visit Summary and Postpartum booklet. Activity: Advance as tolerated. Pelvic rest for 6 weeks.  Diet: routine diet Future Appointments: Future Appointments  Date Time Provider [place]  02/21/2021  8:35 AM Rasch, Artist Pais, NP [place] None   Follow up Visit:  Follow-up [place] Follow up on 02/21/2021.   Specialty: Obstetrics and Gynecology Why: blood pressure recheck at 8:35 AM Contact information: [address] [place] [phone]                Message sent to [facility] by R. Renato Battles, CNM: Please schedule this patient for a In person postpartum visit in 4 weeks with the following provider: Any FEMALE Provider. Additional Postpartum F/U:Postpartum Depression checkup  and BP check 1 week  Low risk pregnancy complicated by: HTN Delivery mode:  Vaginal, Spontaneous  Anticipated Birth Control:  POPs BP elevated to 130s/90s on day of discharge, treated with procardia 30 mg qd.   02/16/2021 Gladys Damme, MD

## 2021-02-14 NOTE — MAU Provider Note (Signed)
Chief Complaint:  Contractions   Event Date/Time   First Provider Initiated Contact with Patient 02/14/21 0018     HPI: Kathy Davenport is a 30 y.o. G1P0 at 84w0dho presents to maternity admissions reporting uterine contractions.  While being evaluated for labor by RN, noted to have elevated BPs.  . She reports good fetal movement, denies LOF, vaginal bleeding, vaginal itching/burning, urinary symptoms, h/a, dizziness, n/v, diarrhea, constipation or fever/chills.  She denies headache, visual changes or RUQ abdominal pain.  Hypertension This is a new (Did have 130s/80s in office) problem. The current episode started today. The problem is unchanged. Associated symptoms include peripheral edema (new). Pertinent negatives include no anxiety, blurred vision, chest pain, headaches or shortness of breath. There are no associated agents to hypertension. There are no known risk factors for coronary artery disease. Past treatments include nothing. There are no compliance problems.     Past Medical History: Past Medical History:  Diagnosis Date   ADHD (attention deficit hyperactivity disorder)    Anxiety    Asthma    Depression     Past obstetric history: OB History  Gravida Para Term Preterm AB Living  1            SAB IAB Ectopic Multiple Live Births               # Outcome Date GA Lbr Len/2nd Weight Sex Delivery Anes PTL Lv  1 Current             Past Surgical History: Past Surgical History:  Procedure Laterality Date   ELBOW FRACTURE SURGERY      Family History: Family History  Problem Relation Age of Onset   Hypertension Mother     Social History: Social History   Tobacco Use   Smoking status: Former    Types: E-cigarettes   Smokeless tobacco: Never   Tobacco comments:    vape  VScientific laboratory technicianUse: Former  Substance Use Topics   Alcohol use: Not Currently   Drug use: Never    Allergies:  Allergies  Allergen Reactions   Penicillins Hives    Meds:   Medications Prior to Admission  Medication Sig Dispense Refill Last Dose   acetaminophen (TYLENOL) 325 MG tablet Take 650 mg by mouth every 6 (six) hours as needed.   Past Week   albuterol (PROVENTIL) (2.5 MG/3ML) 0.083% nebulizer solution Take 2.5 mg by nebulization every 6 (six) hours as needed for wheezing or shortness of breath.   Past Week   aspirin EC 81 MG tablet Take 1 tablet (81 mg total) by mouth daily. Take after 12 weeks for prevention of preeclampsia later in pregnancy 300 tablet 2 02/13/2021   diphenhydrAMINE (BENADRYL) 25 MG tablet Take 25 mg by mouth every 6 (six) hours as needed.   Past Week   doxylamine, Sleep, (UNISOM) 25 MG tablet Take 25 mg by mouth at bedtime as needed.   Past Week   escitalopram (LEXAPRO) 20 MG tablet Take 1 tablet (20 mg total) by mouth daily. 30 tablet 5 02/13/2021   hydrOXYzine (VISTARIL) 25 MG capsule Take 1 capsule (25 mg total) by mouth 3 (three) times daily as needed. 30 capsule 5 02/13/2021   Prenatal Vit-Fe Fumarate-FA (PRENATAL MULTIVITAMIN) TABS tablet Take 1 tablet by mouth daily at 12 noon.   02/13/2021   Blood Pressure Monitoring (BLOOD PRESSURE KIT) DEVI 1 kit by Does not apply route as needed. Large 1 each 0     I  have reviewed patient's Past Medical Hx, Surgical Hx, Family Hx, Social Hx, medications and allergies.   ROS:  Review of Systems  Eyes:  Negative for blurred vision.  Respiratory:  Negative for shortness of breath.   Cardiovascular:  Negative for chest pain.  Neurological:  Negative for headaches.  Other systems negative  Physical Exam  Patient Vitals for the past 24 hrs:  BP Temp Temp src Pulse Resp SpO2 Height Weight  02/14/21 0015 130/88 -- -- 93 -- 95 % -- --  02/14/21 0000 (!) 138/98 -- -- 95 -- 98 % -- --  02/13/21 2345 (!) 125/91 -- -- 95 -- 97 % -- --  02/13/21 2330 (!) 143/97 -- -- 80 -- 98 % -- --  02/13/21 2325 (!) 144/90 98.2 F (36.8 C) Oral 100 18 97 % -- --  02/13/21 2124 (!) 134/91 98 F (36.7 C) -- 100  18 99 % _0  (1.6 m) --  02/13/21 2122 -- -- -- -- -- -- -- 112.1 kg   Constitutional: Well-developed, well-nourished female in no acute distress.  Cardiovascular: normal rate and rhythm Respiratory: normal effort, clear to auscultation bilaterally GI: Abd soft, non-tender, gravid appropriate for gestational age.   No rebound or guarding. MS: Extremities nontender, no edema, normal ROM Neurologic: Alert and oriented x 4.  GU: Neg CVAT.  PELVIC EXAM:  Dilation: 1.5 Effacement (%): 50 Cervical Position: Posterior Station: -3 Exam by:: Youlanda Roys, RN  FHT:  Baseline 135 , moderate variability, accelerations present, no decelerations Contractions: Irregular     Labs: Results for orders placed or performed during the hospital encounter of 02/13/21 (from the past 24 hour(s))  Protein / creatinine ratio, urine     Status: None   Collection Time: 02/13/21 11:44 PM  Result Value Ref Range   Creatinine, Urine 80.22 mg/dL   Total Protein, Urine 6 mg/dL   Protein Creatinine Ratio 0.07 0.00 - 0.15 mg/mg[Cre]  CBC     Status: Abnormal   Collection Time: 02/13/21 11:56 PM  Result Value Ref Range   WBC 10.7 (H) 4.0 - 10.5 K/uL   RBC 3.60 (L) 3.87 - 5.11 MIL/uL   Hemoglobin 11.0 (L) 12.0 - 15.0 g/dL   HCT 33.7 (L) 36.0 - 46.0 %   MCV 93.6 80.0 - 100.0 fL   MCH 30.6 26.0 - 34.0 pg   MCHC 32.6 30.0 - 36.0 g/dL   RDW 14.2 11.5 - 15.5 %   Platelets 251 150 - 400 K/uL   nRBC 0.0 0.0 - 0.2 %  Comprehensive metabolic panel     Status: Abnormal   Collection Time: 02/13/21 11:56 PM  Result Value Ref Range   Sodium 135 135 - 145 mmol/L   Potassium 4.0 3.5 - 5.1 mmol/L   Chloride 104 98 - 111 mmol/L   CO2 21 (L) 22 - 32 mmol/L   Glucose, Bld 88 70 - 99 mg/dL   BUN 9 6 - 20 mg/dL   Creatinine, Ser 0.64 0.44 - 1.00 mg/dL   Calcium 8.8 (L) 8.9 - 10.3 mg/dL   Total Protein 6.1 (L) 6.5 - 8.1 g/dL   Albumin 2.6 (L) 3.5 - 5.0 g/dL   AST 16 15 - 41 U/L   ALT 13 0 - 44 U/L   Alkaline  Phosphatase 97 38 - 126 U/L   Total Bilirubin 0.4 0.3 - 1.2 mg/dL   GFR, Estimated >60 >60 mL/min   Anion gap 10 5 - 15    A/Positive/-- (02/09  1017)  Imaging:  No results found.  MAU Course/MDM: I have ordered labs and reviewed results. These are normal  NST reviewed, reactive Consult Dr Kennon Rounds with presentation, exam findings and test results. Given persistent hypertension at 39 wks, will admit for Gestational HTN Treatments in MAU included EFM.    Assessment: Single IUP at 23w0dProdromal Labor Gestational Hypertension  Plan: Admit to Labor and Delivery Routine orders IOL per protocol Labor team to follow  MHansel FeinsteinCNM, MSN Certified Nurse-Midwife 02/14/2021 12:19 AM

## 2021-02-14 NOTE — Anesthesia Preprocedure Evaluation (Signed)
Anesthesia Evaluation  Patient identified by MRN, date of birth, ID band Patient awake    Reviewed: Allergy & Precautions, NPO status , Patient's Chart, lab work & pertinent test results  Airway Mallampati: III  TM Distance: >3 FB Neck ROM: Full    Dental no notable dental hx.    Pulmonary asthma , former smoker,    Pulmonary exam normal breath sounds clear to auscultation       Cardiovascular hypertension (gHTN), Normal cardiovascular exam Rhythm:Regular Rate:Normal     Neuro/Psych PSYCHIATRIC DISORDERS Anxiety Depression negative neurological ROS     GI/Hepatic negative GI ROS, Neg liver ROS,   Endo/Other  Morbid obesity (BMI 44)  Renal/GU negative Renal ROS  negative genitourinary   Musculoskeletal negative musculoskeletal ROS (+)   Abdominal   Peds  Hematology negative hematology ROS (+)   Anesthesia Other Findings IOL for gHTN  Reproductive/Obstetrics (+) Pregnancy                             Anesthesia Physical Anesthesia Plan  ASA: 3  Anesthesia Plan: Epidural   Post-op Pain Management:    Induction:   PONV Risk Score and Plan: Treatment may vary due to age or medical condition  Airway Management Planned: Natural Airway  Additional Equipment:   Intra-op Plan:   Post-operative Plan:   Informed Consent: I have reviewed the patients History and Physical, chart, labs and discussed the procedure including the risks, benefits and alternatives for the proposed anesthesia with the patient or authorized representative who has indicated his/her understanding and acceptance.       Plan Discussed with: Anesthesiologist  Anesthesia Plan Comments: (Patient identified. Risks, benefits, options discussed with patient including but not limited to bleeding, infection, nerve damage, paralysis, failed block, incomplete pain control, headache, blood pressure changes, nausea, vomiting,  reactions to medication, itching, and post partum back pain. Confirmed with bedside nurse the patient's most recent platelet count. Confirmed with the patient that they are not taking any anticoagulation, have any bleeding history or any family history of bleeding disorders. Patient expressed understanding and wishes to proceed. All questions were answered. )        Anesthesia Quick Evaluation

## 2021-02-14 NOTE — Lactation Note (Signed)
This note was copied from a baby's chart. Lactation Consultation Note  Patient Name: Kathy Davenport [id number] Date: 02/14/2021 Reason for consult: L&D Initial assessment Age:32 hours LC entered the room in L&D, mom was doing skin to skin with infant, infant was cuing to breastfeed. Mom latch infant on her left breast using the football hold position, infant latched with depth and breastfeed for 17 minutes. Afterwards mom was taught hand expression and infant was given 3 mls of colostrum by spoon.  Mom knows to breastfeed infant according to primal cues: licking, kissing, smacking, tasting, hands and fist in mouth, breastfeed infant skin to skin. Mom knows to call RN or LC on MBU if she has any questions, concerns or need assistance with latching infant at the breast.  Maternal Data Has patient been taught Hand Expression?: Yes Does the patient have breastfeeding experience prior to this delivery?: No  Feeding Mother's Current Feeding Choice: Breast Milk and Formula  LATCH Score Latch: Grasps breast easily, tongue down, lips flanged, rhythmical sucking.  Audible Swallowing: Spontaneous and intermittent  Type of Nipple: Everted at rest and after stimulation  Comfort (Breast/Nipple): Soft / non-tender  Hold (Positioning): Full assist, staff holds infant at breast  LATCH Score: 8   Lactation Tools Discussed/Used    Interventions Interventions: Breast feeding basics reviewed;Assisted with latch;Skin to skin;Hand express;Support pillows;Position options;Adjust position;Breast compression  Discharge    Consult Status Consult Status: Follow-up Date: 02/15/21 Follow-up type: In-patient    Danelle Earthly 02/14/2021, 11:27 PM

## 2021-02-14 NOTE — Progress Notes (Signed)
Kathy Davenport is a 30 y.o. G1P0 at [redacted]w[redacted]d admitted for induction of labor due to gestational hypertension.  Subjective:  Comfortable with epidural.   Objective: BP 121/81   Pulse 87   Temp 98.1 F (36.7 C) (Oral)   Resp 18   Ht 5\' 3"  (1.6 m)   Wt 112.1 kg   LMP 05/17/2020   SpO2 95%   BMI 43.79 kg/m  No intake/output data recorded. Total I/O In: -  Out: 700 [Urine:700]  FHT:  FHR: 130 bpm, variability: moderate,  accelerations:  Present,  decelerations:  Absent UC:   difficult to trace with toco  SVE:   4/90/0/AROM Clear fluid  IUPC placed   Labs: Lab Results  Component Value Date   WBC 10.7 (H) 02/13/2021   HGB 11.0 (L) 02/13/2021   HCT 33.7 (L) 02/13/2021   MCV 93.6 02/13/2021   PLT 251 02/13/2021    Assessment / Plan: Induction of labor due to gestational hypertension,  progressing well on pitocin  Labor:  progressing. IUPC placed to assess contractions and titrate pitocin more effectively  Preeclampsia:  labs stable Fetal Wellbeing:  Category I Pain Control:  Epidural I/D:  n/a Anticipated MOD:  NSVD  02/15/2021 DNP, CNM  02/14/21  12:58 PM

## 2021-02-14 NOTE — Anesthesia Procedure Notes (Signed)
Epidural Patient location during procedure: OB Start time: 02/14/2021 4:25 AM End time: 02/14/2021 4:35 AM  Staffing Anesthesiologist: Elmer Picker, MD Performed: anesthesiologist   Preanesthetic Checklist Completed: patient identified, IV checked, risks and benefits discussed, monitors and equipment checked, pre-op evaluation and timeout performed  Epidural Patient position: sitting Prep: DuraPrep and site prepped and draped Patient monitoring: continuous pulse ox, blood pressure, heart rate and cardiac monitor Approach: midline Location: L3-L4 Injection technique: LOR air  Needle:  Needle type: Tuohy  Needle gauge: 17 G Needle length: 9 cm Needle insertion depth: 5 cm Catheter type: closed end flexible Catheter size: 19 Gauge Catheter at skin depth: 10 cm Test dose: negative  Assessment Sensory level: T8 Events: blood not aspirated, injection not painful, no injection resistance, no paresthesia and negative IV test  Additional Notes Patient identified. Risks/Benefits/Options discussed with patient including but not limited to bleeding, infection, nerve damage, paralysis, failed block, incomplete pain control, headache, blood pressure changes, nausea, vomiting, reactions to medication both or allergic, itching and postpartum back pain. Confirmed with bedside nurse the patient's most recent platelet count. Confirmed with patient that they are not currently taking any anticoagulation, have any bleeding history or any family history of bleeding disorders. Patient expressed understanding and wished to proceed. All questions were answered. Sterile technique was used throughout the entire procedure. Please see nursing notes for vital signs. Test dose was given through epidural catheter and negative prior to continuing to dose epidural or start infusion. Warning signs of high block given to the patient including shortness of breath, tingling/numbness in hands, complete motor block,  or any concerning symptoms with instructions to call for help. Patient was given instructions on fall risk and not to get out of bed. All questions and concerns addressed with instructions to call with any issues or inadequate analgesia.  Reason for block:procedure for pain

## 2021-02-14 NOTE — H&P (Addendum)
OBSTETRIC ADMISSION HISTORY AND PHYSICAL  Kathy Davenport is a 30 y.o. female G1P0 with IUP at 52w0dby LMP c/w 10 week UKoreapresenting for contractions. She reports +FMs, no LOF, no VB, no blurry vision, headaches, peripheral edema, or RUQ pain.  She plans on breast and bottle feeding. She request POPs for birth control as a bridge to vasectomy. Patient did not have significant cervical change while in the MAU; however, it was noted that her blood pressures were consistently elevated. Patient was subsequently admitted for IOL due to gHTN.   [facility]    Dating: By LMP --->  Estimated Date of Delivery: 02/21/21  Sono:   @[redacted]w[redacted]d , CWD, normal anatomy, cephalic presentation, posterior placental lie, 386 g, 80% EFW  Prenatal History/Complications:  Anxiety  Depression ADHD Obesity (BMI 441  Past Medical History: Past Medical History:  Diagnosis Date   ADHD (attention deficit hyperactivity disorder)    Anxiety    Asthma    Depression     Past Surgical History: Past Surgical History:  Procedure Laterality Date   ELBOW FRACTURE SURGERY     TOOTH EXTRACTION      Obstetrical History: OB History     Gravida  1   Para      Term      Preterm      AB      Living         SAB      IAB      Ectopic      Multiple      Live Births              Social History Social History   Socioeconomic History   Marital status: Married    Spouse name: Not on file   Number of children: Not on file   Years of education: Not on file   Highest education level: Not on file  Occupational History   Not on file  Tobacco Use   Smoking status: Former    Types: E-cigarettes   Smokeless tobacco: Never   Tobacco comments:    vape  VScientific laboratory technicianUse: Former  Substance and Sexual Activity   Alcohol use: Not Currently   Drug use: Never   Sexual activity: Yes  Other Topics Concern   Not on file  Social History Narrative   Not on file    Social Determinants of Health   Financial Resource Strain: Not on file  Food Insecurity: Not on file  Transportation Needs: Not on file  Physical Activity: Not on file  Stress: Not on file  Social Connections: Not on file    Family History: Family History  Problem Relation Age of Onset   Hypertension Mother     Allergies: Allergies  Allergen Reactions   Penicillins Hives    Medications Prior to Admission  Medication Sig Dispense Refill Last Dose   acetaminophen (TYLENOL) 325 MG tablet Take 650 mg by mouth every 6 (six) hours as needed.   Past Week   albuterol (PROVENTIL) (2.5 MG/3ML) 0.083% nebulizer solution Take 2.5 mg by nebulization every 6 (six) hours as needed for wheezing or shortness of breath.   Past Week   aspirin EC 81 MG tablet Take 1 tablet (81 mg total) by mouth daily. Take after 12 weeks for prevention of preeclampsia later in pregnancy 300 tablet 2 02/13/2021   Blood Pressure Monitoring (BLOOD PRESSURE KIT) DEVI 1 kit by Does not apply  route as needed. Large 1 each 0 02/13/2021   diphenhydrAMINE (BENADRYL) 25 MG tablet Take 25 mg by mouth every 6 (six) hours as needed.   Past Week   doxylamine, Sleep, (UNISOM) 25 MG tablet Take 25 mg by mouth at bedtime as needed.   Past Week   escitalopram (LEXAPRO) 20 MG tablet Take 1 tablet (20 mg total) by mouth daily. 30 tablet 5 02/13/2021   hydrOXYzine (VISTARIL) 25 MG capsule Take 1 capsule (25 mg total) by mouth 3 (three) times daily as needed. 30 capsule 5 02/13/2021   Prenatal Vit-Fe Fumarate-FA (PRENATAL MULTIVITAMIN) TABS tablet Take 1 tablet by mouth daily at 12 noon.   Past Week     Review of Systems  All systems reviewed and negative except as stated in HPI  Blood pressure 130/87, pulse 95, temperature 98.1 F (36.7 C), temperature source Oral, resp. rate 18, height 5' 3"  (1.6 m), weight 112.1 kg, last menstrual period 05/17/2020, SpO2 98 %.  General appearance: alert, cooperative, and no distress Lungs:  normal work of breathing on room air Heart: normal rate; warm and well-perfused Abdomen: gravid; nontender to palpation Extremities: no LE edema or calf tenderness to palpation   Presentation: cephalic Fetal monitoring: Baseline 145 bpm, moderate variability, + accels, - decels  Uterine activity: Every 2-3 minutes  Dilation: 3 Effacement (%): 50 Station: -3 Exam by:: Dr. Gwenlyn Perking   Prenatal labs: ABO, Rh: --/--/A POS (08/19 0145) Antibody: NEG (08/19 0145) Rubella: 3.71 (02/09 1017) RPR: Non Reactive (06/13 0954)  HBsAg: Negative (02/09 1017)  HIV: Non Reactive (06/13 0954)  GBS: Negative/-- (08/08 0341)  1 hr Glucola normal Genetic screening normal (LR NIPS and neg AFP) Anatomy US normal  Prenatal Transfer Tool  Maternal Diabetes: No Genetic Screening: Normal Maternal Ultrasounds/Referrals: Normal Fetal Ultrasounds or other Referrals:  None Maternal Substance Abuse:  No Significant Maternal Medications:  Lexapro 20 mg daily, Vistaril 25 mg PRN Significant Maternal Lab Results: Group B Strep negative  Results for orders placed or performed during the hospital encounter of 02/13/21 (from the past 24 hour(s))  Protein / creatinine ratio, urine   Collection Time: 02/13/21 11:44 PM  Result Value Ref Range   Creatinine, Urine 80.22 mg/dL   Total Protein, Urine 6 mg/dL   Protein Creatinine Ratio 0.07 0.00 - 0.15 mg/mg[Cre]  CBC   Collection Time: 02/13/21 11:56 PM  Result Value Ref Range   WBC 10.7 (H) 4.0 - 10.5 K/uL   RBC 3.60 (L) 3.87 - 5.11 MIL/uL   Hemoglobin 11.0 (L) 12.0 - 15.0 g/dL   HCT 33.7 (L) 36.0 - 46.0 %   MCV 93.6 80.0 - 100.0 fL   MCH 30.6 26.0 - 34.0 pg   MCHC 32.6 30.0 - 36.0 g/dL   RDW 14.2 11.5 - 15.5 %   Platelets 251 150 - 400 K/uL   nRBC 0.0 0.0 - 0.2 %  Comprehensive metabolic panel   Collection Time: 02/13/21 11:56 PM  Result Value Ref Range   Sodium 135 135 - 145 mmol/L   Potassium 4.0 3.5 - 5.1 mmol/L   Chloride 104 98 - 111 mmol/L   CO2  21 (L) 22 - 32 mmol/L   Glucose, Bld 88 70 - 99 mg/dL   BUN 9 6 - 20 mg/dL   Creatinine, Ser 0.64 0.44 - 1.00 mg/dL   Calcium 8.8 (L) 8.9 - 10.3 mg/dL   Total Protein 6.1 (L) 6.5 - 8.1 g/dL   Albumin 2.6 (L) 3.5 - 5.0 g/dL  AST 16 15 - 41 U/L   ALT 13 0 - 44 U/L   Alkaline Phosphatase 97 38 - 126 U/L   Total Bilirubin 0.4 0.3 - 1.2 mg/dL   GFR, Estimated >60 >60 mL/min   Anion gap 10 5 - 15  Type and screen [place]   Collection Time: 02/14/21  1:45 AM  Result Value Ref Range   ABO/RH(D) A POS    Antibody Screen NEG    Sample Expiration      02/17/2021,2359 Performed at [facility], [place] [address], [place], [place] [address]     Patient Active Problem List   Diagnosis Date Noted   Gestational hypertension 02/14/2021   BMI 30.0-30.9,adult 08/07/2020   Depression 08/07/2020   ADHD (attention deficit hyperactivity disorder)    Anxiety    Supervision of normal first pregnancy, antepartum 07/10/2020    Assessment/Plan:  Kathy Davenport is a 30 y.o. G1P0 at 71w0dhere for contractions and [place]  #Labor: SVE now 3/50/-3. Patient is feeling her contractions and reports they are getting more painful - every 2-3 minutes. Will expectantly manage for now and reassess at next check.  #Pain: IV Fentanyl; Epidural PRN #FWB: Cat 1 #ID:  GBS negative  #MOF: Breast/Bottle  #MOC: POPs > Vasectomy  #Circ:  Yes  #Gestational HTN: Mild range BPs. Asymptomatic. Pre-E labs normal on admission. Will continue to monitor.  CGenia Del MD  OB Fellow  Faculty Practice 02/14/2021, 3:50 AM

## 2021-02-15 MED ORDER — SIMETHICONE 80 MG PO CHEW: 80.0000 mg | CHEWABLE_TABLET | ORAL | Status: DC | PRN

## 2021-02-15 MED ORDER — BENZOCAINE-MENTHOL 20-0.5 % EX AERO
1.0000 "application " | INHALATION_SPRAY | CUTANEOUS | Status: DC | PRN
  Administered 2021-02-15: 1 via TOPICAL
  Filled 2021-02-15: qty 56

## 2021-02-15 MED ORDER — IBUPROFEN 600 MG PO TABS
600.0000 mg | ORAL_TABLET | Freq: Four times a day (QID) | ORAL | Status: DC
  Administered 2021-02-15 – 2021-02-16 (×6): 600 mg via ORAL
  Filled 2021-02-15 (×8): qty 1

## 2021-02-15 MED ORDER — ACETAMINOPHEN 325 MG PO TABS
650.0000 mg | ORAL_TABLET | ORAL | Status: DC | PRN
  Administered 2021-02-15: 650 mg via ORAL
  Filled 2021-02-15: qty 2

## 2021-02-15 MED ORDER — ONDANSETRON HCL 4 MG PO TABS: 4.0000 mg | ORAL_TABLET | ORAL | Status: DC | PRN

## 2021-02-15 MED ORDER — ONDANSETRON HCL 4 MG/2ML IJ SOLN: 4.0000 mg | INTRAMUSCULAR | Status: DC | PRN

## 2021-02-15 MED ORDER — DIPHENHYDRAMINE HCL 25 MG PO CAPS: 25.0000 mg | ORAL_CAPSULE | Freq: Four times a day (QID) | ORAL | Status: DC | PRN

## 2021-02-15 MED ORDER — WITCH HAZEL-GLYCERIN EX PADS
1.0000 "application " | MEDICATED_PAD | CUTANEOUS | Status: DC | PRN
  Administered 2021-02-15: 1 via TOPICAL

## 2021-02-15 MED ORDER — ALBUTEROL SULFATE (2.5 MG/3ML) 0.083% IN NEBU: 2.5000 mg | INHALATION_SOLUTION | Freq: Four times a day (QID) | RESPIRATORY_TRACT | Status: DC | PRN

## 2021-02-15 MED ORDER — ESCITALOPRAM OXALATE 20 MG PO TABS
20.0000 mg | ORAL_TABLET | Freq: Every day | ORAL | Status: DC
  Administered 2021-02-15 – 2021-02-16 (×2): 20 mg via ORAL
  Filled 2021-02-15 (×2): qty 1

## 2021-02-15 MED ORDER — SENNOSIDES-DOCUSATE SODIUM 8.6-50 MG PO TABS
2.0000 | ORAL_TABLET | Freq: Every day | ORAL | Status: DC
  Administered 2021-02-15 – 2021-02-16 (×2): 2 via ORAL
  Filled 2021-02-15 (×2): qty 2

## 2021-02-15 MED ORDER — COCONUT OIL OIL
1.0000 "application " | TOPICAL_OIL | Status: DC | PRN
  Administered 2021-02-15: 1 via TOPICAL

## 2021-02-15 MED ORDER — TETANUS-DIPHTH-ACELL PERTUSSIS 5-2.5-18.5 LF-MCG/0.5 IM SUSY: 0.5000 mL | PREFILLED_SYRINGE | Freq: Once | INTRAMUSCULAR | Status: DC

## 2021-02-15 MED ORDER — PRENATAL MULTIVITAMIN CH
1.0000 | ORAL_TABLET | Freq: Every day | ORAL | Status: DC
Start: 2021-02-15 — End: 2021-02-16
  Administered 2021-02-15 – 2021-02-16 (×2): 1 via ORAL
  Filled 2021-02-15 (×2): qty 1

## 2021-02-15 MED ORDER — ZOLPIDEM TARTRATE 5 MG PO TABS: 5.0000 mg | ORAL_TABLET | Freq: Every evening | ORAL | Status: DC | PRN

## 2021-02-15 MED ORDER — DIBUCAINE (PERIANAL) 1 % EX OINT
1.0000 | TOPICAL_OINTMENT | CUTANEOUS | Status: DC | PRN
Start: 2021-02-15 — End: 2021-02-16

## 2021-02-15 NOTE — Progress Notes (Signed)
MOB was referred for history of depression/anxiety. * Referral screened out by Clinical Social Worker because none of the following criteria appear to apply: ~ History of anxiety/depression during this pregnancy, or of post-partum depression following prior delivery. ~ Diagnosis of anxiety and/or depression within last 3 years OR * MOB's symptoms currently being treated with medication and/or therapy. MOB has an active Rx for Lexapro and Vistaril. MOB also has outpatient counseling weekly. Per OB records, MOB's symptoms are managed with current interventions.   Please contact the Clinical Social Worker if needs arise, by [facility] request, or if MOB scores greater than 9/yes to question 10 on Edinburgh Postpartum Depression Screen.   Blaine Hamper, MSW, LCSW Clinical Social Work [phone]

## 2021-02-15 NOTE — Plan of Care (Signed)

## 2021-02-15 NOTE — Progress Notes (Signed)
POSTPARTUM PROGRESS NOTE  Post Partum Day 1  Subjective:  Kathy Davenport is a 30 y.o. G1P1001 s/p SVD at [redacted]w[redacted]d for gHTN.  No acute events overnight.  Pt denies problems with ambulating, voiding or po intake.  She denies nausea or vomiting.  Pain is well controlled.  She has not had flatus. She has not had bowel movement.  Lochia Minimal.   Objective: Blood pressure 117/82, pulse 89, temperature 98.4 F (36.9 C), temperature source Oral, resp. rate 18, height 5\' 3"  (1.6 m), weight 112.1 kg, last menstrual period 05/17/2020, SpO2 99 %, unknown if currently breastfeeding.  Patient Vitals for the past 24 hrs:  BP Temp Temp src Pulse Resp SpO2  02/15/21 0911 117/82 98.4 F (36.9 C) Oral 89 18 99 %  02/15/21 0500 128/77 97.8 F (36.6 C) Oral 91 -- 98 %  02/15/21 0130 126/81 (!) 97.5 F (36.4 C) Oral 95 -- 98 %  02/15/21 0032 117/83 98.8 F (37.1 C) Oral 85 18 97 %  02/14/21 2246 139/88 -- -- 100 -- --  02/14/21 2231 124/80 -- -- 97 -- --  02/14/21 2216 137/88 -- -- (!) 102 -- --  02/14/21 2201 (!) 144/75 -- -- 96 -- --  02/14/21 2145 -- -- -- -- -- 98 %  02/14/21 2135 -- -- -- -- -- 99 %  02/14/21 2131 126/82 -- -- 97 -- --  02/14/21 2124 -- -- -- -- -- 99 %  02/14/21 2120 -- -- -- -- -- 100 %  02/14/21 2114 -- -- -- -- -- 99 %  02/14/21 2110 -- -- -- -- -- 99 %  02/14/21 2105 -- -- -- -- -- 100 %  02/14/21 2101 127/76 -- -- 79 -- --  02/14/21 2100 -- -- -- -- -- 98 %  02/14/21 2055 -- -- -- -- -- 96 %  02/14/21 2050 -- -- -- -- -- 100 %  02/14/21 2045 -- -- -- -- -- 100 %  02/14/21 2040 -- -- -- -- -- 100 %  02/14/21 2031 (!) 140/95 -- -- (!) 120 -- --  02/14/21 2001 107/73 -- -- 75 -- --  02/14/21 1931 112/61 -- -- 76 -- --  02/14/21 1924 -- 97.9 F (36.6 C) Oral -- -- --  02/14/21 1901 130/72 -- -- 78 -- --  02/14/21 1831 116/73 -- -- 68 -- --  02/14/21 1801 121/74 -- -- 70 -- --  02/14/21 1731 125/77 98.1 F (36.7 C) Oral 76 16 --  02/14/21 1701 127/83 -- -- 79 -- --   02/14/21 1501 113/64 -- -- 84 -- --  02/14/21 1431 129/63 -- -- 80 -- --  02/14/21 1401 (!) 142/93 -- -- 82 -- --  02/14/21 1331 127/67 98.1 F (36.7 C) Oral 92 16 --  02/14/21 1301 116/75 -- -- 89 -- --  02/14/21 1201 (!) 142/87 -- -- 76 -- --  02/14/21 1131 121/81 -- -- 87 -- --   Physical Exam:  General: alert, cooperative and no distress Chest: no respiratory distress Heart:regular rate Abdomen: soft, nontender,  Uterine Fundus: firm, appropriately tender DVT Evaluation: No calf swelling or tenderness Extremities: No edema Skin: warm, dry  Recent Labs    02/13/21 2356  HGB 11.0*  HCT 33.7*    Assessment/Plan: Kathy Davenport is a 30 y.o. G1P1001 s/p SVD at [redacted]w[redacted]d   PPD#1 - Doing well Contraception: POP Feeding: both Dispo: Plan for discharge tomorrow.   LOS: 1 day   Shaquella Stamant, [redacted]w[redacted]d, NP,  CNM 02/15/2021, 11:28 AM

## 2021-02-15 NOTE — Anesthesia Postprocedure Evaluation (Signed)
Anesthesia Post Note  Patient: Kathy Davenport  Procedure(s) Performed: AN AD HOC LABOR EPIDURAL     Patient location during evaluation: Mother Baby Anesthesia Type: Epidural Level of consciousness: awake and alert, oriented and patient cooperative Pain management: pain level controlled Vital Signs Assessment: post-procedure vital signs reviewed and stable Respiratory status: spontaneous breathing Cardiovascular status: stable Postop Assessment: no headache, epidural receding, patient able to bend at knees and no signs of nausea or vomiting Anesthetic complications: no Comments: Pt. States she is walking.    No notable events documented.  Last Vitals:  Vitals:   02/15/21 0130 02/15/21 0500  BP: 126/81 128/77  Pulse: 95 91  Resp:    Temp: (!) 36.4 C 36.6 C  SpO2: 98% 98%    Last Pain:  Vitals:   02/15/21 0500  TempSrc: Oral  PainSc: 3    Pain Goal:                   [facility]

## 2021-02-16 MED ORDER — IBUPROFEN 600 MG PO TABS: 600.0000 mg | ORAL_TABLET | Freq: Four times a day (QID) | ORAL | 0 refills | Status: DC

## 2021-02-16 MED ORDER — NORETHINDRONE 0.35 MG PO TABS: 1.0000 | ORAL_TABLET | Freq: Every day | ORAL | 11 refills | Status: DC

## 2021-02-16 MED ORDER — NIFEDIPINE ER OSMOTIC RELEASE 30 MG PO TB24: 30.0000 mg | ORAL_TABLET | Freq: Every day | ORAL | 0 refills | Status: DC

## 2021-02-16 MED ORDER — ACETAMINOPHEN 325 MG PO TABS: 650.0000 mg | ORAL_TABLET | ORAL | 0 refills | Status: DC | PRN

## 2021-02-16 NOTE — Lactation Note (Signed)
This note was copied from a baby's chart. Lactation Consultation Note  Patient Name: Kathy Davenport CNOBS'J Date: 02/16/2021 Reason for consult: Follow-up assessment;Primapara;Term Age:32 hours  Initial visit on postpartum floor. Mom was assisted with latch while she was sitting in recliner. Infant latched with ease & noted to have swallows, but suck-swallow bursts were short with extended breaks in between.   Mom was willing to try side-lying position. Infant latched and swallowing frequency improved (sometimes with a 1:1 suck-swallow ratio) and suck-swallow bursts were much longer. Infant unlatched on his own & Mom's nipple shape was rounded (previously it had been slightly pinched). Mom was pleased with how well infant fed. Infant came off breast and had an emesis of Mom's colostrum. Infant was placed skin-to-skin on Dad and "[name]" fell asleep.  Parents can identify sound of swallows. Parents know they can call for me to return if they'd like further assistance. Dad was made aware of O/P services, breastfeeding support groups, and our phone # for post-discharge questions.    I have no concerns.  Lurline Hare [facility] 02/16/2021, 11:02 AM

## 2021-02-17 ENCOUNTER — Telehealth: Payer: Self-pay

## 2021-02-17 NOTE — Telephone Encounter (Signed)
Transition Care Management Unsuccessful Follow-up Telephone Call  Date of discharge and from where:  02/16/2021-[facility]  Attempts:  1st Attempt  Reason for unsuccessful TCM follow-up call:  Left voice message

## 2021-02-18 NOTE — Telephone Encounter (Signed)
Transition Care Management Follow-up Telephone Call Date of discharge and from where: 02/16/2021 from [facility] How have you been since you were released from the hospital? Pt stated that she is feeling good.  Any questions or concerns? No  Items Reviewed: Did the pt receive and understand the discharge instructions provided? Yes  Medications obtained and verified? Yes  Other? No  Any new allergies since your discharge? No  Dietary orders reviewed? No Do you have support at home? Yes   Functional Questionnaire: (I = Independent and D = Dependent) ADLs: I  Bathing/Dressing- I  Meal Prep- I  Eating- I  Maintaining continence- I  Transferring/Ambulation- I  Managing Meds- I   Follow up appointments reviewed:  PCP Hospital f/u appt confirmed? No   Specialist Hospital f/u appt confirmed? Yes  Scheduled to see Sharen Counter, CNM on 03/25/2021 @ 10:15am. Are transportation arrangements needed? No  If their condition worsens, is the pt aware to call PCP or go to the Emergency Dept.? Yes Was the patient provided with contact information for the PCP's office or ED? Yes Was to pt encouraged to call back with questions or concerns? Yes

## 2021-02-21 ENCOUNTER — Ambulatory Visit: Payer: 59

## 2021-02-21 ENCOUNTER — Encounter: Payer: 59 | Admitting: Obstetrics and Gynecology

## 2021-02-24 ENCOUNTER — Telehealth (HOSPITAL_COMMUNITY): Payer: Self-pay

## 2021-02-24 NOTE — Telephone Encounter (Signed)
No answer. Left message to return nurse call.  Marcelino Duster [facility] 02/24/2021,1957

## 2021-02-25 ENCOUNTER — Ambulatory Visit (INDEPENDENT_AMBULATORY_CARE_PROVIDER_SITE_OTHER): Payer: 59 | Admitting: Licensed Clinical Social Worker

## 2021-02-25 ENCOUNTER — Other Ambulatory Visit: Payer: Self-pay

## 2021-02-25 DIAGNOSIS — F419 Anxiety disorder, unspecified: Secondary | ICD-10-CM | POA: Diagnosis not present

## 2021-02-27 DIAGNOSIS — Z419 Encounter for procedure for purposes other than remedying health state, unspecified: Secondary | ICD-10-CM | POA: Diagnosis not present

## 2021-02-27 NOTE — BH Specialist Note (Signed)
Integrated Behavioral Health Follow Up In-Person Visit  MRN: [id number] Name: Kathy Davenport  Number of Integrated Behavioral Health Clinician visits: 2/6 Session Start time: 11:21am  Session End time: 12:00pm Total time: 41 minutes  in person at [facility]   Types of Service: General Behavioral Integrated Care (BHI)  Interpretor:No. Interpretor Name and Language: none   Subjective: Kathy Davenport is a 30 y.o. female accompanied by n/a Patient was referred by Kathy Has NP for anxiety. Patient reports the following symptoms/concerns: anxious mood, worried, overwhelmed  Duration of problem: approx 3 weeks ; Severity of problem: mild  Objective: Mood: Anxious and Affect: Appropriate Risk of harm to self or others: No plan to harm self or others  Life Context: Family and Social: Lives with spouse and newborn in [place] School/Work: Grad school  Self-Care: sleep  Life Changes: newborn son Kathy Davenport   Patient and/or Family's Strengths/Protective Factors: Concrete supports in place (healthy food, safe environments, etc.)  Goals Addressed: Patient will:  Reduce symptoms of: anxiety   Increase knowledge and/or ability of: coping skills and stress reduction   Demonstrate ability to: Increase healthy adjustment to current life circumstances  Progress towards Goals: Ongoing  Interventions: Interventions utilized:  Supportive Counseling Standardized Assessments completed: Edinburgh Postnatal Depression  Patient and/or Family Response: Kathy Davenport reports increase anxious mood since delivery. Identified triggers is family members urging to see newborn. Kathy Davenport reports feeling nervous and overprotective with newborn son. Kathy Davenport reports spouse and in-laws are supportive.   Assessment: Patient currently experiencing anxiety.   Patient may benefit from integrated behavioral health.  Plan: Follow up with behavioral health clinician on : 03/25/2021 Behavioral  recommendations: Delegate and prioritize task, communicate needs with spouse and engage in self care  Referral(s): Integrated [employer] (In Clinic) "From scale of 1-10, how likely are you to follow plan?":   Kathy Saxon, LCSW

## 2021-03-25 ENCOUNTER — Encounter: Payer: Self-pay | Admitting: Advanced Practice Midwife

## 2021-03-25 ENCOUNTER — Ambulatory Visit (INDEPENDENT_AMBULATORY_CARE_PROVIDER_SITE_OTHER): Payer: 59 | Admitting: Advanced Practice Midwife

## 2021-03-25 ENCOUNTER — Other Ambulatory Visit: Payer: Self-pay

## 2021-03-25 DIAGNOSIS — K5900 Constipation, unspecified: Secondary | ICD-10-CM

## 2021-03-25 DIAGNOSIS — M545 Low back pain, unspecified: Secondary | ICD-10-CM

## 2021-03-25 DIAGNOSIS — L2082 Flexural eczema: Secondary | ICD-10-CM

## 2021-03-25 DIAGNOSIS — N6342 Unspecified lump in left breast, subareolar: Secondary | ICD-10-CM

## 2021-03-25 DIAGNOSIS — Z8759 Personal history of other complications of pregnancy, childbirth and the puerperium: Secondary | ICD-10-CM

## 2021-03-25 MED ORDER — DOCUSATE SODIUM 100 MG PO CAPS
100.0000 mg | ORAL_CAPSULE | Freq: Two times a day (BID) | ORAL | 0 refills | Status: DC | PRN
Start: 2021-03-25 — End: 2022-09-22

## 2021-03-25 MED ORDER — CLOBETASOL PROPIONATE 0.05 % EX OINT: 1.0000 "application " | TOPICAL_OINTMENT | Freq: Two times a day (BID) | CUTANEOUS | 0 refills | Status: DC

## 2021-03-25 NOTE — Progress Notes (Signed)
Post Partum Visit Note  Kathy Davenport is a 30 y.o. G86P1001 female who presents for a postpartum visit. She is 5 weeks postpartum following a normal spontaneous vaginal delivery.  I have fully reviewed the prenatal and intrapartum course. The delivery was at 39 gestational weeks.  Anesthesia: epidural. Postpartum course has been unremarkable. Baby is doing well. Baby is feeding by both breast and bottle - Enfamil . Bleeding staining only. Bowel function is normal. Bladder function is normal. Patient is not sexually active. Contraception method is  POPs . Postpartum depression screening: negative.   Upstream - 03/25/21 1034       Pregnancy Intention Screening   Does the patient want to become pregnant in the next year? No    Does the patient's partner want to become pregnant in the next year? No    Would the patient like to discuss contraceptive options today? Yes      Contraception Wrap Up   Current Method Oral Contraceptive    Contraception Counseling Provided Yes            The pregnancy intention screening data noted above was reviewed. Potential methods of contraception were discussed. The patient elected to proceed with No data recorded.   Edinburgh Postnatal Depression Scale - 03/25/21 1034       Edinburgh Postnatal Depression Scale:  In the Past 7 Days   I have been able to laugh and see the funny side of things. 0    I have looked forward with enjoyment to things. 0    I have blamed myself unnecessarily when things went wrong. 0    I have been anxious or worried for no good reason. 0    I have felt scared or panicky for no good reason. 0    Things have been getting on top of me. 0    I have been so unhappy that I have had difficulty sleeping. 0    I have felt sad or miserable. 0    I have been so unhappy that I have been crying. 0    The thought of harming myself has occurred to me. 0    Edinburgh Postnatal Depression Scale Total 0             Health  Maintenance Due  Topic Date Due   COVID-19 Vaccine (1) Never done   INFLUENZA VACCINE  01/27/2021    The following portions of the patient's history were reviewed and updated as appropriate: allergies, current medications, past family history, past medical history, past social history, past surgical history, and problem list.  Review of Systems Pertinent items noted in HPI and remainder of comprehensive ROS otherwise negative.  Objective:  BP 116/76   Pulse 79   Wt 223 lb (101.2 kg)   LMP 05/17/2020   Breastfeeding Yes   BMI 39.50 kg/m    VS reviewed, nursing note reviewed,  Constitutional: well developed, well nourished, no distress HEENT: normocephalic CV: normal rate Pulm/chest wall: normal effort Abdomen: soft Neuro: alert and oriented x 3 Skin: warm, dry Psych: affect normal   Assessment:   1. Postpartum care following vaginal delivery --Doing well overall, bonding well with baby,good support at home  2. Lump in central portion of left breast --C/W plugged duct, no evidence of mastitis.  Reviewed techniques to try including heat, massage, breastfeeding/pumping in multiple positions including dangling of breast for better emptying --Warning signs/reasons to seek care reviewed  3. Flexural eczema --Small patch of dry  skin in left hand during late pregnancy, now with frequent hand washing, patch on left and right hand with eczematous reaction. --Discussed using gentle nonperfumed soaps instead of antibacterial soaps until healed - clobetasol ointment (TEMOVATE) 0.05 %; Apply 1 application topically 2 (two) times daily. Apply to affected area  Dispense: 30 g; Refill: 0  4. Constipation, unspecified constipation type --Reviewed dietary changes - docusate sodium (COLACE) 100 MG capsule; Take 1 capsule (100 mg total) by mouth 2 (two) times daily as needed.  Dispense: 30 capsule; Refill: 0  5. Acute midline low back pain without sciatica --Pain since late pregnancy, now  increased with breastfeeding, carrying baby, etc.  Also has pain in neck area since childbirth. - Ambulatory referral to Physical Therapy   Plan:   Essential components of care per ACOG recommendations:  1.  Mood and well being: Patient with negative depression screening today. Reviewed local resources for support.  - Patient tobacco use? No.   - hx of drug use? No.    2. Infant care and feeding:  -Patient currently breastmilk feeding? Yes. Reviewed importance of draining breast regularly to support lactation.  -Social determinants of health (SDOH) reviewed in EPIC. No concerns  3. Sexuality, contraception and birth spacing - Patient does not want a pregnancy in the next year.   - Reviewed forms of contraception in tiered fashion. Patient desired oral progesterone-only contraceptive today.   - Discussed birth spacing of 18 months  4. Sleep and fatigue -Encouraged family/partner/community support of 4 hrs of uninterrupted sleep to help with mood and fatigue  5. Physical Recovery  - Discussed patients delivery and complications. She describes her labor as good. - Patient had a Vaginal, no problems at delivery. Patient had a 2nd degree laceration. Perineal healing reviewed. Patient expressed understanding - Patient has urinary incontinence? No. - Patient is safe to resume physical and sexual activity  6.  Health Maintenance - HM due items addressed Yes - Last pap smear  Diagnosis  Date Value Ref Range Status  08/07/2020   Final   - Negative for Intraepithelial Lesions or Malignancy (NILM)  08/07/2020 - Benign reactive/reparative changes  Final   Pap smear not done at today's visit.  -Breast Cancer screening indicated? No.   7. Chronic Disease/Pregnancy Condition follow up: Hypertension  - PCP follow up  Sharen Counter, CNM [facility] [employer], [facility]

## 2021-03-29 DIAGNOSIS — Z419 Encounter for procedure for purposes other than remedying health state, unspecified: Secondary | ICD-10-CM | POA: Diagnosis not present

## 2021-04-29 DIAGNOSIS — Z419 Encounter for procedure for purposes other than remedying health state, unspecified: Secondary | ICD-10-CM | POA: Diagnosis not present

## 2021-05-29 DIAGNOSIS — Z419 Encounter for procedure for purposes other than remedying health state, unspecified: Secondary | ICD-10-CM | POA: Diagnosis not present

## 2021-06-29 DIAGNOSIS — Z419 Encounter for procedure for purposes other than remedying health state, unspecified: Secondary | ICD-10-CM | POA: Diagnosis not present

## 2021-07-30 DIAGNOSIS — Z419 Encounter for procedure for purposes other than remedying health state, unspecified: Secondary | ICD-10-CM | POA: Diagnosis not present

## 2021-08-27 DIAGNOSIS — Z419 Encounter for procedure for purposes other than remedying health state, unspecified: Secondary | ICD-10-CM | POA: Diagnosis not present

## 2021-09-27 DIAGNOSIS — Z419 Encounter for procedure for purposes other than remedying health state, unspecified: Secondary | ICD-10-CM | POA: Diagnosis not present

## 2021-10-27 DIAGNOSIS — Z419 Encounter for procedure for purposes other than remedying health state, unspecified: Secondary | ICD-10-CM | POA: Diagnosis not present

## 2021-10-29 ENCOUNTER — Telehealth: Payer: Self-pay | Admitting: *Deleted

## 2021-10-29 NOTE — Telephone Encounter (Signed)
Pt called to office stating she feels she may have a clogged milk duct again. Pt states antibiotic use was discussed at her previous appt. Pt request message to provider for recommendations. ? ?Pt has also been scheduled for an in office visit to evaluate.  ? ? ?Please review and advise.  ?

## 2021-10-30 ENCOUNTER — Ambulatory Visit (INDEPENDENT_AMBULATORY_CARE_PROVIDER_SITE_OTHER): Payer: Medicaid Other | Admitting: Obstetrics and Gynecology

## 2021-10-30 ENCOUNTER — Encounter: Payer: Self-pay | Admitting: Obstetrics and Gynecology

## 2021-10-30 VITALS — BP 115/69 | HR 92 | Ht 64.0 in | Wt 233.0 lb

## 2021-10-30 DIAGNOSIS — N644 Mastodynia: Secondary | ICD-10-CM | POA: Diagnosis not present

## 2021-10-30 NOTE — Progress Notes (Signed)
31 y.o GYN presents for breast pain 4-8/10, tenderness x 9 months, fever x 2-3 days. Patient is breastfeeding her 12 months old baby. ?

## 2021-10-30 NOTE — Progress Notes (Signed)
? ? ?GYNECOLOGY  ENCOUNTER NOTE ? ?History:    ? Kathy Davenport is a 31 y.o. G27P1001 female here with complaints of breast pain. Last week she spiked a 100.5 fever and noticed a tender area on her right breast. She was intentional about emptying her breast and symptoms improved. Yesterday she developed a 101 fever with bilateral breast pain, along with GI symptoms. She is unsure if she had food poisoning. She is requesting evaluation of her breasts.  Her baby is 55 months old and she continues to breast feed.  No fever today. Pain today is worse in her left breast. No redness today.  ? ?Obstetric History ?OB History  ?Gravida Para Term Preterm AB Living  ?1 1 1     1   ?SAB IAB Ectopic Multiple Live Births  ?      0 1  ?  ?# Outcome Date GA Lbr Len/2nd Weight Sex Delivery Anes PTL Lv  ?1 Term 02/14/21 [id number] / 01:27 7 lb 5.3 oz (3.325 kg) M Vag-Spont EPI  LIV  ? ? ?Past Medical History:  ?Diagnosis Date  ? ADHD (attention deficit hyperactivity disorder)   ? Anxiety   ? Asthma   ? Depression   ? ? ?Past Surgical History:  ?Procedure Laterality Date  ? ELBOW FRACTURE SURGERY    ? TOOTH EXTRACTION    ? ? ?Current Outpatient Medications on File Prior to Visit  ?Medication Sig Dispense Refill  ? acetaminophen (TYLENOL) 325 MG tablet Take 2 tablets (650 mg total) by mouth every 4 (four) hours as needed (for pain scale < 4). 60 tablet 0  ? albuterol (PROVENTIL) (2.5 MG/3ML) 0.083% nebulizer solution Take 2.5 mg by nebulization every 6 (six) hours as needed for wheezing or shortness of breath.    ? aspirin EC 81 MG tablet Take 1 tablet (81 mg total) by mouth daily. Take after 12 weeks for prevention of preeclampsia later in pregnancy (Patient not taking: Reported on 03/25/2021) 300 tablet 2  ? Blood Pressure Monitoring (BLOOD PRESSURE KIT) DEVI 1 kit by Does not apply route as needed. Large 1 each 0  ? clobetasol ointment (TEMOVATE) 09.14% Apply 1 application topically 2 (two) times daily. Apply to affected area 30 g 0   ? diphenhydrAMINE (BENADRYL) 25 MG tablet Take 25 mg by mouth every 6 (six) hours as needed.    ? docusate sodium (COLACE) 100 MG capsule Take 1 capsule (100 mg total) by mouth 2 (two) times daily as needed. (Patient not taking: Reported on 10/30/2021) 30 capsule 0  ? doxylamine, Sleep, (UNISOM) 25 MG tablet Take 25 mg by mouth at bedtime as needed. (Patient not taking: Reported on 10/30/2021)    ? escitalopram (LEXAPRO) 20 MG tablet Take 1 tablet (20 mg total) by mouth daily. 30 tablet 5  ? hydrOXYzine (VISTARIL) 25 MG capsule Take 1 capsule (25 mg total) by mouth 3 (three) times daily as needed. (Patient not taking: Reported on 03/25/2021) 30 capsule 5  ? ibuprofen (ADVIL) 600 MG tablet Take 1 tablet (600 mg total) by mouth every 6 (six) hours. 30 tablet 0  ? NIFEdipine (PROCARDIA XL) 30 MG 24 hr tablet Take 1 tablet (30 mg total) by mouth daily. (Patient not taking: Reported on 03/25/2021) 30 tablet 0  ? norethindrone (ORTHO MICRONOR) 0.35 MG tablet Take 1 tablet (0.35 mg total) by mouth daily. (Patient not taking: Reported on 10/30/2021) 28 tablet 11  ? Prenatal Vit-Fe Fumarate-FA (PRENATAL MULTIVITAMIN) TABS tablet Take 1 tablet  by mouth daily at 12 noon.    ? ?No current facility-administered medications on file prior to visit.  ? ? ?Allergies  ?Allergen Reactions  ? Penicillins Hives  ? ? ?Social History:  reports that she has quit smoking. Her smoking use included e-cigarettes. She has never used smokeless tobacco. She reports current alcohol use. She reports current drug use. Drug: Marijuana. ? ?Family History  ?Problem Relation Age of Onset  ? Hypertension Mother   ? ? ?The following portions of the patient's history were reviewed and updated as appropriate: allergies, current medications, past family history, past medical history, past social history, past surgical history and problem list. ? ?Review of Systems ?Pertinent items noted in HPI and remainder of comprehensive ROS otherwise negative. ? ?Physical Exam:   ?BP 115/69   Pulse 92   Ht 5' 4"  (1.626 m)   Wt 233 lb (105.7 kg)   Breastfeeding Yes   BMI 39.99 kg/m?  ?CONSTITUTIONAL: Well-developed, well-nourished female in no acute distress.  ?HENT:  Normocephalic, ?SKIN: Skin is warm and dry. No rash noted.  ?MUSCULOSKELETAL: Normal range of motion. ?NEUROLOGIC: Alert and oriented to person, place, and time.  ?BREASTS: Symmetric in size. No masses, tenderness, skin changes, nipple drainage, or lymphadenopathy bilaterally. Performed in the presence of a chaperone. ?  ?Assessment and Plan:  ?1. Breast pain ? ?- Ambulatory referral to Lactation ?- Would not treat with antibiotics at this time as this could be inflammatory. She can send me a mychart message if fever and symptoms return in the next week ?Continue ice, massage, tylenol/ibuprofen.  ? ? ?Krishon Adkison, Artist Pais, NP ?Faculty Practice ?[facility] [place]  ?

## 2021-11-27 DIAGNOSIS — Z419 Encounter for procedure for purposes other than remedying health state, unspecified: Secondary | ICD-10-CM | POA: Diagnosis not present

## 2021-12-27 DIAGNOSIS — Z419 Encounter for procedure for purposes other than remedying health state, unspecified: Secondary | ICD-10-CM | POA: Diagnosis not present

## 2022-06-29 NOTE — L&D Delivery Note (Signed)
Koneta was found to be completely dilated at 2136 and started pushing shortly thereafter. Little descent noted and decision was made to allow for passive descent after consultation with MD Valentino Saxon. Pushing restarted at around 2300; deep variables noted with little descent, so decision was made after consulting again with MD Valentino Saxon to allow further passive descent. Patient pushed using squat bar, and fetal tachycardia with marked variability were noted, so patient rested further. Pushing re-started around 0150.   Delivery Note At 0230, a viable female was delivered vaginally over an intact perineum. Nuchal cord x1 reduced on the perineum. Vertex presented ROA, with restitution to ROT. Shoulders presented OT and delivered easily thereafter. Infant placed immediately on maternal abdomen for tactile stimulation. See newborn nursery RN note. APGARS 6 and 9; weight pending.    Placenta status: delivered at 74. Cord: three vessel.   With the following complications: none .    Anesthesia:  epidural  Episiotomy:  none Lacerations:  none Suture Repair:  n/a Est. Blood Loss (mL):  100  Mom to  postpartum .  Baby to Couplet care / Skin to Skin.  Infant delivered by Cindra Eves, SNM.    Mirna Mires 04/22/2023, 3:19 AM

## 2022-08-10 DIAGNOSIS — F419 Anxiety disorder, unspecified: Secondary | ICD-10-CM | POA: Insufficient documentation

## 2022-08-27 ENCOUNTER — Ambulatory Visit (INDEPENDENT_AMBULATORY_CARE_PROVIDER_SITE_OTHER): Payer: Medicaid Other

## 2022-08-27 ENCOUNTER — Encounter: Payer: Self-pay | Admitting: Obstetrics

## 2022-08-27 VITALS — BP 118/83 | HR 96 | Ht 64.0 in | Wt 207.3 lb

## 2022-08-27 DIAGNOSIS — Z1339 Encounter for screening examination for other mental health and behavioral disorders: Secondary | ICD-10-CM

## 2022-08-27 DIAGNOSIS — N912 Amenorrhea, unspecified: Secondary | ICD-10-CM

## 2022-08-27 LAB — POCT URINE PREGNANCY: Preg Test, Ur: POSITIVE — AB

## 2022-08-27 NOTE — Progress Notes (Signed)
Ms. Brimberry presents today for UPT. She has no unusual complaints. LMP: 07/18/22 Patient is 63w5dwith an EDD 04/24/23    OBJECTIVE: Appears well, in no apparent distress.  OB History     Gravida  1   Para  1   Term  1   Preterm      AB      Living  1      SAB      IAB      Ectopic      Multiple  0   Live Births  1          Home UPT Result: Positive In-Office UPT result: Positive I have reviewed the patient's medical, obstetrical, social, and family histories, and medications.   ASSESSMENT: Positive pregnancy test  [facility] [place] [facility] [place]

## 2022-09-10 ENCOUNTER — Other Ambulatory Visit (HOSPITAL_BASED_OUTPATIENT_CLINIC_OR_DEPARTMENT_OTHER): Payer: Self-pay

## 2022-09-10 ENCOUNTER — Emergency Department (HOSPITAL_BASED_OUTPATIENT_CLINIC_OR_DEPARTMENT_OTHER): Payer: Medicaid Other

## 2022-09-10 ENCOUNTER — Encounter (HOSPITAL_BASED_OUTPATIENT_CLINIC_OR_DEPARTMENT_OTHER): Payer: Self-pay | Admitting: Emergency Medicine

## 2022-09-10 ENCOUNTER — Other Ambulatory Visit: Payer: Self-pay

## 2022-09-10 ENCOUNTER — Other Ambulatory Visit: Payer: Self-pay | Admitting: *Deleted

## 2022-09-10 ENCOUNTER — Emergency Department (HOSPITAL_BASED_OUTPATIENT_CLINIC_OR_DEPARTMENT_OTHER)
Admission: EM | Admit: 2022-09-10 | Discharge: 2022-09-10 | Disposition: A | Discharge status: Home / Self Care | Payer: Medicaid Other | Attending: Emergency Medicine | Admitting: Emergency Medicine

## 2022-09-10 DIAGNOSIS — R000 Tachycardia, unspecified: Secondary | ICD-10-CM | POA: Diagnosis not present

## 2022-09-10 DIAGNOSIS — J45901 Unspecified asthma with (acute) exacerbation: Secondary | ICD-10-CM

## 2022-09-10 DIAGNOSIS — Z3A01 Less than 8 weeks gestation of pregnancy: Secondary | ICD-10-CM | POA: Insufficient documentation

## 2022-09-10 DIAGNOSIS — O26891 Other specified pregnancy related conditions, first trimester: Secondary | ICD-10-CM | POA: Diagnosis present

## 2022-09-10 DIAGNOSIS — O99511 Diseases of the respiratory system complicating pregnancy, first trimester: Secondary | ICD-10-CM | POA: Insufficient documentation

## 2022-09-10 DIAGNOSIS — Z20822 Contact with and (suspected) exposure to covid-19: Secondary | ICD-10-CM | POA: Insufficient documentation

## 2022-09-10 LAB — RESP PANEL BY RT-PCR (RSV, FLU A&B, COVID)  RVPGX2
Influenza A by PCR: NEGATIVE
Influenza B by PCR: NEGATIVE
Resp Syncytial Virus by PCR: NEGATIVE
SARS Coronavirus 2 by RT PCR: NEGATIVE

## 2022-09-10 MED ORDER — AEROCHAMBER PLUS FLO-VU MISC
1.0000 | Freq: Once | Status: AC
  Administered 2022-09-10: 1
  Filled 2022-09-10: qty 1

## 2022-09-10 MED ORDER — FLUTICASONE PROPIONATE HFA 220 MCG/ACT IN AERO: 2.0000 | INHALATION_SPRAY | Freq: Two times a day (BID) | RESPIRATORY_TRACT | 12 refills | Status: AC

## 2022-09-10 MED ORDER — IPRATROPIUM-ALBUTEROL 0.5-2.5 (3) MG/3ML IN SOLN
RESPIRATORY_TRACT | Status: AC
  Filled 2022-09-10: qty 3

## 2022-09-10 MED ORDER — ALBUTEROL SULFATE (2.5 MG/3ML) 0.083% IN NEBU
INHALATION_SOLUTION | RESPIRATORY_TRACT | Status: AC
  Administered 2022-09-10: 5 mg
  Filled 2022-09-10: qty 6

## 2022-09-10 MED ORDER — MONTELUKAST SODIUM 10 MG PO TABS: 10.0000 mg | ORAL_TABLET | Freq: Every day | ORAL | 1 refills | Status: DC

## 2022-09-10 MED ORDER — ALBUTEROL SULFATE HFA 108 (90 BASE) MCG/ACT IN AERS
INHALATION_SPRAY | RESPIRATORY_TRACT | Status: AC
  Administered 2022-09-10: 2 via RESPIRATORY_TRACT
  Filled 2022-09-10: qty 6.7

## 2022-09-10 MED ORDER — ALBUTEROL SULFATE (2.5 MG/3ML) 0.083% IN NEBU
INHALATION_SOLUTION | RESPIRATORY_TRACT | Status: AC
  Filled 2022-09-10: qty 3

## 2022-09-10 MED ORDER — IPRATROPIUM-ALBUTEROL 0.5-2.5 (3) MG/3ML IN SOLN
3.0000 mL | Freq: Once | RESPIRATORY_TRACT | Status: AC
  Administered 2022-09-10: 3 mL via RESPIRATORY_TRACT

## 2022-09-10 MED ORDER — IPRATROPIUM-ALBUTEROL 0.5-2.5 (3) MG/3ML IN SOLN: 3.0000 mL | Freq: Once | RESPIRATORY_TRACT | Status: AC

## 2022-09-10 MED ORDER — PREDNISONE 20 MG PO TABS: 60.0000 mg | ORAL_TABLET | Freq: Every day | ORAL | 0 refills | Status: DC

## 2022-09-10 MED ORDER — ALBUTEROL SULFATE (2.5 MG/3ML) 0.083% IN NEBU
2.5000 mg | INHALATION_SOLUTION | Freq: Once | RESPIRATORY_TRACT | Status: AC
  Administered 2022-09-10: 2.5 mg via RESPIRATORY_TRACT

## 2022-09-10 MED ORDER — ALBUTEROL SULFATE HFA 108 (90 BASE) MCG/ACT IN AERS: 2.0000 | INHALATION_SPRAY | RESPIRATORY_TRACT | Status: DC | PRN

## 2022-09-10 MED ORDER — ALBUTEROL SULFATE (2.5 MG/3ML) 0.083% IN NEBU: 2.5000 mg | INHALATION_SOLUTION | Freq: Once | RESPIRATORY_TRACT | Status: AC

## 2022-09-10 MED ORDER — IPRATROPIUM-ALBUTEROL 0.5-2.5 (3) MG/3ML IN SOLN
RESPIRATORY_TRACT | Status: AC
  Administered 2022-09-10: 3 mL
  Filled 2022-09-10: qty 3

## 2022-09-10 NOTE — Progress Notes (Signed)
TC from pt reporting cont'd asthma exacerbation in early pregnancy. Seen in ED this AM and given 3 HHNs with albuterol. ED provider recommended f/u w/ PCP for inhaled corticosteroids. Pt unable to get prompt appt with PCP. Consulted with Dr. Harolyn Rutherford. Verbal orders for Prednisone '60mg'$  QD X 5 days with food, Flovent 263mg 2 puffs BID, Singulair 10 mg QD. Plan of care/ RX's discussed with pt. Pt verbalized understanding. Advised to seek care immediately for any acute SOB. RXs sent to pt's preferred pharmacy.

## 2022-09-10 NOTE — ED Triage Notes (Signed)
Pt arrives to ED with c/o SOB, wheezing, chest tightness x3-4 days. Was recently dx with Bronchitis last week as was prescribed Keflex and Tessalon pearls. She notes that she is 7 weeks 5 days pregnant. Associated with dry cough. Hx asthma.

## 2022-09-10 NOTE — Discharge Instructions (Addendum)
Thank you for allowing me to be part of your care today.  I recommend contacting your primary care provider and setting up a follow-up appointment regarding your asthma.  You may need to be started on an inhaled corticosteroid to help with your symptoms.  Please continue to use your albuterol inhaler as needed.  I also recommend getting a HEPA filter for your home to help purify the air as well as a coolmist humidifier to keep the air moist.  Environmental irritants and dry air can exacerbate asthma.  Return to the ED if you have worsening of your symptoms or if you have any new concerns.

## 2022-09-10 NOTE — ED Provider Notes (Signed)
[facility] Provider Note   CSN: [id number] Arrival date & time: 09/10/22  0920     History  Chief Complaint  Patient presents with   Shortness of Breath   Wheezing    Kathy Davenport is a 32 y.o. female with past medical history significant for asthma presents to the ED complaining of shortness of breath, wheezing, and chest tightness for the past 3 to 4 days.  Patient was seen at urgent care and diagnosed with acute bronchitis was prescribed Keflex and Tessalon Perles.  Patient is 55w5dpregnant.  She states that she was given steroids prior to finding out that she was pregnant.  Patient has been using her albuterol inhaler every 4 hours as needed, but states that she cannot seem to resolve her wheezing.  She reports she has not been swabbed for COVID, flu, or RSV.  Denies chest pain, palpitations, nausea, vomiting, fever, chills, syncope.       Home Medications Prior to Admission medications   Medication Sig Start Date End Date Taking? Authorizing Provider  benzonatate (TESSALON) 100 MG capsule Take 100 mg by mouth 3 (three) times daily as needed. 09/01/22  Yes [provider]  cephALEXin (KEFLEX) 500 MG capsule Take 500 mg by mouth 3 (three) times daily. 09/01/22  Yes [provider]  acetaminophen (TYLENOL) 325 MG tablet Take 2 tablets (650 mg total) by mouth every 4 (four) hours as needed (for pain scale < 4). 02/16/21   MGladys Damme MD  albuterol (PROVENTIL) (2.5 MG/3ML) 0.083% nebulizer solution Take 2.5 mg by nebulization every 6 (six) hours as needed for wheezing or shortness of breath.    [provider]  albuterol (VENTOLIN HFA) 108 (90 Base) MCG/ACT inhaler Inhale into the lungs. 08/10/22 09/16/22  [provider]  aspirin EC 81 MG tablet Take 1 tablet (81 mg total) by mouth daily. Take after 12 weeks for prevention of preeclampsia later in pregnancy Patient not taking: Reported on 03/25/2021  08/07/20   DSloan Leiter MD  Blood Pressure Monitoring (BLOOD PRESSURE KIT) DEVI 1 kit by Does not apply route as needed. Large 07/10/20   BGriffin Basil MD  clobetasol ointment (TEMOVATE) 0AB-123456789% Apply 1 application topically 2 (two) times daily. Apply to affected area 03/25/21   Leftwich-Kirby, LLattie HawA, CNM  docusate sodium (COLACE) 100 MG capsule Take 1 capsule (100 mg total) by mouth 2 (two) times daily as needed. Patient not taking: Reported on 10/30/2021 03/25/21   LElvera Maria CNM  doxylamine, Sleep, (UNISOM) 25 MG tablet Take 25 mg by mouth at bedtime as needed.    [provider]  escitalopram (LEXAPRO) 20 MG tablet Take 1 tablet (20 mg total) by mouth daily. Patient not taking: Reported on 08/27/2022 01/06/21   Leftwich-Kirby, LKathie Dike CNM  fexofenadine-pseudoephedrine (ALLEGRA-D) 60-120 MG 12 hr tablet Take by mouth.    [provider]  hydrOXYzine (VISTARIL) 25 MG capsule Take 1 capsule (25 mg total) by mouth 3 (three) times daily as needed. Patient not taking: Reported on 03/25/2021 01/06/21   LFatima BlankA, CNM  ibuprofen (ADVIL) 600 MG tablet Take 1 tablet (600 mg total) by mouth every 6 (six) hours. Patient not taking: Reported on 08/27/2022 02/16/21   MGladys Damme MD  NIFEdipine (PROCARDIA XL) 30 MG 24 hr tablet Take 1 tablet (30 mg total) by mouth daily. 02/16/21   MGladys Damme MD  norethindrone (ORTHO MICRONOR) 0.35 MG tablet Take 1 tablet (0.35 mg  total) by mouth daily. 02/16/21 02/16/22  Gladys Damme, MD  Prenatal Vit-Fe Fumarate-FA (PRENATAL MULTIVITAMIN) TABS tablet Take 1 tablet by mouth daily at 12 noon.    [provider]  Spacer/Aero-Holding Chambers (EASIVENT) inhaler Use with inhaler as instructed. 08/10/22   [provider]      Allergies    Penicillins    Review of Systems   Review of Systems  Constitutional:  Negative for chills and fever.  Respiratory:  Positive for chest tightness, shortness of breath  and wheezing.   Cardiovascular:  Negative for chest pain and palpitations.  Gastrointestinal:  Negative for nausea and vomiting.  Neurological:  Negative for syncope.    Physical Exam Updated Vital Signs BP (!) 146/96 (BP Location: Right Arm)   Pulse (!) 102   Temp 99.3 F (37.4 C) (Oral)   Resp (!) 29   Ht '5\' 4"'$  (1.626 m)   Wt 97.5 kg   LMP 07/18/2022   SpO2 99%   BMI 36.90 kg/m  Physical Exam Vitals and nursing note reviewed.  Constitutional:      General: She is not in acute distress.    Appearance: Normal appearance. She is not ill-appearing or diaphoretic.  Cardiovascular:     Rate and Rhythm: Regular rhythm. Tachycardia present.     Heart sounds: Normal heart sounds.  Pulmonary:     Effort: Tachypnea (mild) present. No accessory muscle usage or respiratory distress.     Breath sounds: Wheezing present.     Comments: Patient is mildly tachypneic, but is speaking in full sentences.  Inspiratory and expiratory wheezing in all fields auscultated on lung exam. Abdominal:     General: Abdomen is flat.     Palpations: Abdomen is soft.     Tenderness: There is no abdominal tenderness.  Musculoskeletal:     Right lower leg: No edema.     Left lower leg: No edema.  Skin:    General: Skin is warm and dry.     Capillary Refill: Capillary refill takes less than 2 seconds.     Coloration: Skin is not cyanotic or pale.  Neurological:     Mental Status: She is alert. Mental status is at baseline.  Psychiatric:        Mood and Affect: Mood normal.        Behavior: Behavior normal.     ED Results / Procedures / Treatments   Labs (all labs ordered are listed, but only abnormal results are displayed) Labs Reviewed  RESP PANEL BY RT-PCR (RSV, FLU A&B, COVID)  [id number]    EKG EKG Interpretation  Date/Time:  Thursday September 10 2022 09:28:59 EDT Ventricular Rate:  90 PR Interval:  136 QRS Duration: 87 QT Interval:  337 QTC Calculation: 413 R Axis:   43 Text  Interpretation: Sinus rhythm Baseline wander in lead(s) II III aVF V3 V4 V5 V6 no prior ECG for comparison. wandering baseline. No STEMI Confirmed by Antony Blackbird [phone]) on 09/10/2022 9:30:27 AM  Radiology DG Chest Portable 1 View  Result Date: 09/10/2022 CLINICAL DATA:  Provided history: Shortness of breath. Asthma. Wheezing. Cough. Congestion. EXAM: PORTABLE CHEST 1 VIEW COMPARISON:  Prior chest radiographs 04/16/2009. FINDINGS: Heart size within normal limits. Peribronchial thickening. No appreciable airspace consolidation. No evidence of pleural effusion or pneumothorax. No acute osseous abnormality identified. IMPRESSION: Peribronchial thickening. This finding may be due to viral infection, asthma and/or other causes of chronic airway inflammation. No evidence of airspace consolidation. Electronically Signed   By: Marylyn Ishihara  Armandina Gemma D.O.   On: 09/10/2022 10:23    Procedures Procedures    Medications Ordered in ED Medications  albuterol (VENTOLIN HFA) 108 (90 Base) MCG/ACT inhaler 2 puff (2 puffs Inhalation Given 09/10/22 0945)  ipratropium-albuterol (DUONEB) 0.5-2.5 (3) MG/3ML nebulizer solution 3 mL (3 mLs Nebulization Not Given 09/10/22 1050)  albuterol (PROVENTIL) (2.5 MG/3ML) 0.083% nebulizer solution 2.5 mg (5 mg Nebulization Given 09/10/22 0935)  albuterol (PROVENTIL) (2.5 MG/3ML) 0.083% nebulizer solution (  Given 09/10/22 0952)  aerochamber plus with mask device 1 each (1 each Other Given 09/10/22 1034)  ipratropium-albuterol (DUONEB) 0.5-2.5 (3) MG/3ML nebulizer solution 3 mL (3 mLs Nebulization Given 09/10/22 1047)  albuterol (PROVENTIL) (2.5 MG/3ML) 0.083% nebulizer solution 2.5 mg (2.5 mg Nebulization Given 09/10/22 1049)    ED Course/ Medical Decision Making/ A&P                             Medical Decision Making  This patient presents to the ED with chief complaint(s) of shortness of breath, cough, wheezing with pertinent past medical history of asthma, currently pregnant in  first trimester.  The complaint involves an extensive differential diagnosis and also carries with it a high risk of complications and morbidity.    The differential diagnosis includes acute asthma exacerbation, COVID, influenza, RSV, adenovirus, acute bronchitis, pneumonia, other viral URI  The initial plan is to obtain respiratory swab, EKG, and give breathing treatments  Additional history obtained: Records reviewed  urgent care visits since January of this year.  Patient has been seen multiple times for possible viral illness and mild intermittent asthma exacerbation.  Patient has been trialed on different medications including steroids before she found out she was pregnant.  Initial Assessment:   Exam significant for a patient who is not in acute respiratory distress.  She is able to speak in full sentences and is mildly tachypneic upon initial assessment.  Auscultation of lungs revealed inspiratory and expiratory wheezing with diminished tidal volume.  She also does have a dry, nonproductive cough appreciated.  Skin is warm and dry, she is not pale or cyanotic.  She is currently satting in the upper 90s on room air.  Heart rate is mildly tachycardic around 102 with regular rhythm.  Independent ECG/labs interpretation:  The following labs were independently interpreted:  Negative for COVID, flu, RSV  Independent visualization and interpretation of imaging: I independently visualized the following imaging with scope of interpretation limited to determining acute life threatening conditions related to emergency care: Chest x-ray, which revealed peribronchial thickening which may be related to patient's ongoing asthma.  No evidence of infiltration or consolidation to suggest pneumonia.  I agree with radiologist interpretation.  Treatment and Reassessment: Patient was seen and evaluated by respiratory therapy.  Patient was given a total of 3 breathing treatments while in the ED with significant  improvement in tidal volume and wheezing.  Patient does still have expiratory wheezing, but does seem improved following treatments.  Patient was also given an inhaler with spacer to take home to use.  Discussed importance of primary care follow-up as I suspect patient may worsening asthma that may require an inhaled corticosteroid given that she is pregnant and cannot use systemic steroids safely, especially in the first trimester of pregnancy.  Patient states that she was recently assigned a primary care provider on Medicaid and will be calling their office to schedule an appointment.  Patient is in agreement with plan.  Heart rate and respiratory rate also improved following breathing treatments.  Patient is breathing around 14-16 times/min with improved air movement.  I feel that patient is appropriate for discharge home with close PCP follow up.    Disposition:   The patient has been appropriately medically screened and/or stabilized in the ED. I have low suspicion for any other emergent medical condition which would require further screening, evaluation or treatment in the ED or require inpatient management. At time of discharge the patient is hemodynamically stable and in no acute distress. I have discussed work-up results and diagnosis with patient and answered all questions. Patient is agreeable with discharge plan. We discussed strict return precautions for returning to the emergency department and they verbalized understanding.            Final Clinical Impression(s) / ED Diagnoses Final diagnoses:  Mild asthma with acute exacerbation, unspecified whether persistent    Rx / DC Orders ED Discharge Orders     None         Pat Kocher, [place] 09/10/22 1138    Tegeler, Gwenyth Allegra, MD 09/10/22 1458

## 2022-09-10 NOTE — Progress Notes (Signed)
Pt had exp. And insp. wheezes. RT gave the Pt 5 mg of albuterol and she also received 2 additional nebulizer treantments with duoneb and albuterol. The Pt is sounding better. RT will continue to monitor

## 2022-09-22 ENCOUNTER — Other Ambulatory Visit (INDEPENDENT_AMBULATORY_CARE_PROVIDER_SITE_OTHER): Payer: Medicaid Other

## 2022-09-22 ENCOUNTER — Ambulatory Visit (INDEPENDENT_AMBULATORY_CARE_PROVIDER_SITE_OTHER): Payer: Medicaid Other | Admitting: *Deleted

## 2022-09-22 VITALS — BP 125/77 | HR 94 | Ht 64.0 in | Wt 212.4 lb

## 2022-09-22 DIAGNOSIS — Z3480 Encounter for supervision of other normal pregnancy, unspecified trimester: Secondary | ICD-10-CM | POA: Insufficient documentation

## 2022-09-22 DIAGNOSIS — K5900 Constipation, unspecified: Secondary | ICD-10-CM

## 2022-09-22 DIAGNOSIS — Z1339 Encounter for screening examination for other mental health and behavioral disorders: Secondary | ICD-10-CM | POA: Diagnosis not present

## 2022-09-22 DIAGNOSIS — Z3481 Encounter for supervision of other normal pregnancy, first trimester: Secondary | ICD-10-CM

## 2022-09-22 DIAGNOSIS — O3680X0 Pregnancy with inconclusive fetal viability, not applicable or unspecified: Secondary | ICD-10-CM

## 2022-09-22 DIAGNOSIS — Z3A10 10 weeks gestation of pregnancy: Secondary | ICD-10-CM

## 2022-09-22 MED ORDER — DOCUSATE SODIUM 100 MG PO CAPS: 100.0000 mg | ORAL_CAPSULE | Freq: Two times a day (BID) | ORAL | 0 refills | Status: DC | PRN

## 2022-09-22 NOTE — Progress Notes (Signed)
New OB Intake  I connected with Kathy Davenport  on 09/22/22 at  1:10 PM EDT by In Person Visit and verified that I am speaking with the correct person using two identifiers. Nurse is located at [facility] and pt is located at [facility].  I discussed the limitations, risks, security and privacy concerns of performing an evaluation and management service by telephone and the availability of in person appointments. I also discussed with the patient that there may be a patient responsible charge related to this service. The patient expressed understanding and agreed to proceed.  I explained I am completing New OB Intake today. We discussed EDD of 04/24/23 that is based on LMP of 07/18/22. Pt is G2/P1. I reviewed her allergies, medications, Medical/Surgical/OB history, and appropriate screenings. I informed her of [facility] services. [facility] information placed in AVS. Based on history, this is a low risk pregnancy.  Patient Active Problem List   Diagnosis Date Noted   History of gestational hypertension 03/25/2021   BMI 30.0-30.9,adult 08/07/2020   Depression 08/07/2020   ADHD (attention deficit hyperactivity disorder)    Anxiety     Concerns addressed today  Delivery Plans Plans to deliver at [facility]. Patient given information for [facility] Healthy Baby website for more information about Women's and [place]. Patient is not interested in water birth. Offered upcoming OB visit with CNM to discuss further.  MyChart/Babyscripts MyChart access verified. I explained pt will have some visits in office and some virtually. Babyscripts instructions given and order placed. Patient verifies receipt of registration text/e-mail. Account successfully created and app downloaded.  Blood Pressure Cuff/Weight Scale Pt has BP cuff at home. Explained after first prenatal appt pt will check weekly and document in 19. Patient does not have weight scale; patient may purchase if they desire to track weight weekly in  Babyscripts.  Anatomy US Explained first scheduled [place] will be around 19 weeks. Anatomy US scheduled for 19 wks at [facility]. Pt notified to arrive at [facility].  Labs Discussed Johnsie Cancel genetic screening with patient. Would like both Panorama and Horizon drawn at new OB visit. Routine prenatal labs needed.  COVID Vaccine Patient has had COVID vaccine.   Social Determinants of Health Food Insecurity: Patient denies food insecurity. WIC Referral: Patient is interested in referral to [facility].  Transportation: Patient denies transportation needs. Childcare: Discussed no children allowed at ultrasound appointments. Offered childcare services; patient declines childcare services at this time.  Interested in [place]? If yes, send referral and doula dot phrase.   First visit review I reviewed new OB appt with patient. I explained they will have a provider visit that includes Pap, GC/CC, labs, genetic screening and discussion with provider,. Explained pt will be seen by Gailen Shelter, CNM at first visit; encounter routed to appropriate provider. Explained that patient will be seen by pregnancy navigator following visit with provider.   Penny Pia, RN 09/22/2022  1:21 PM

## 2022-10-02 ENCOUNTER — Other Ambulatory Visit: Payer: Self-pay | Admitting: Obstetrics & Gynecology

## 2022-10-02 DIAGNOSIS — J45901 Unspecified asthma with (acute) exacerbation: Secondary | ICD-10-CM

## 2022-10-12 IMAGING — US US OB LIMITED
1 series · 5 of 5 positions shown · non-contrast
Comparison: none

[Series 1: us ob limited · 5 of 5 slices shown]
[im 1/5]
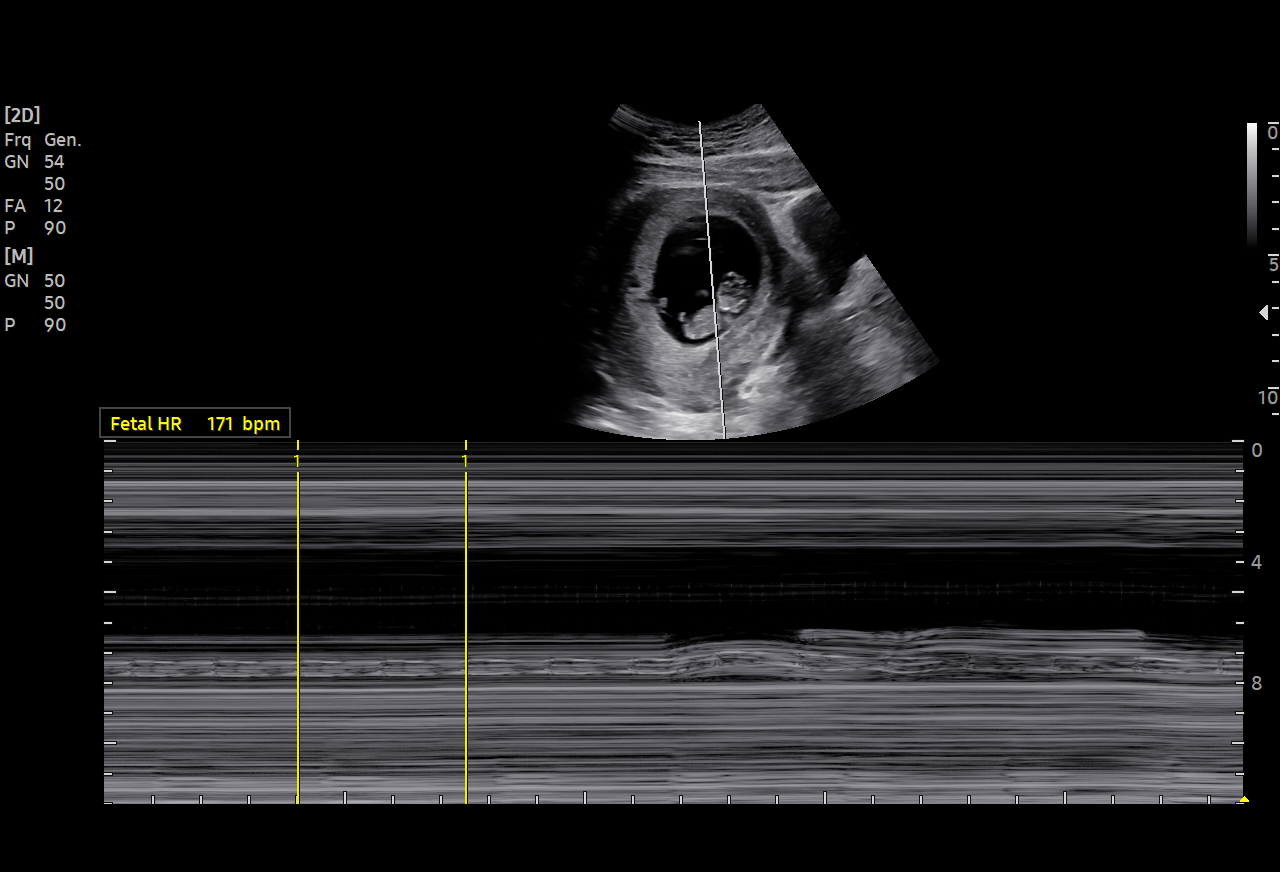
[im 2/5]
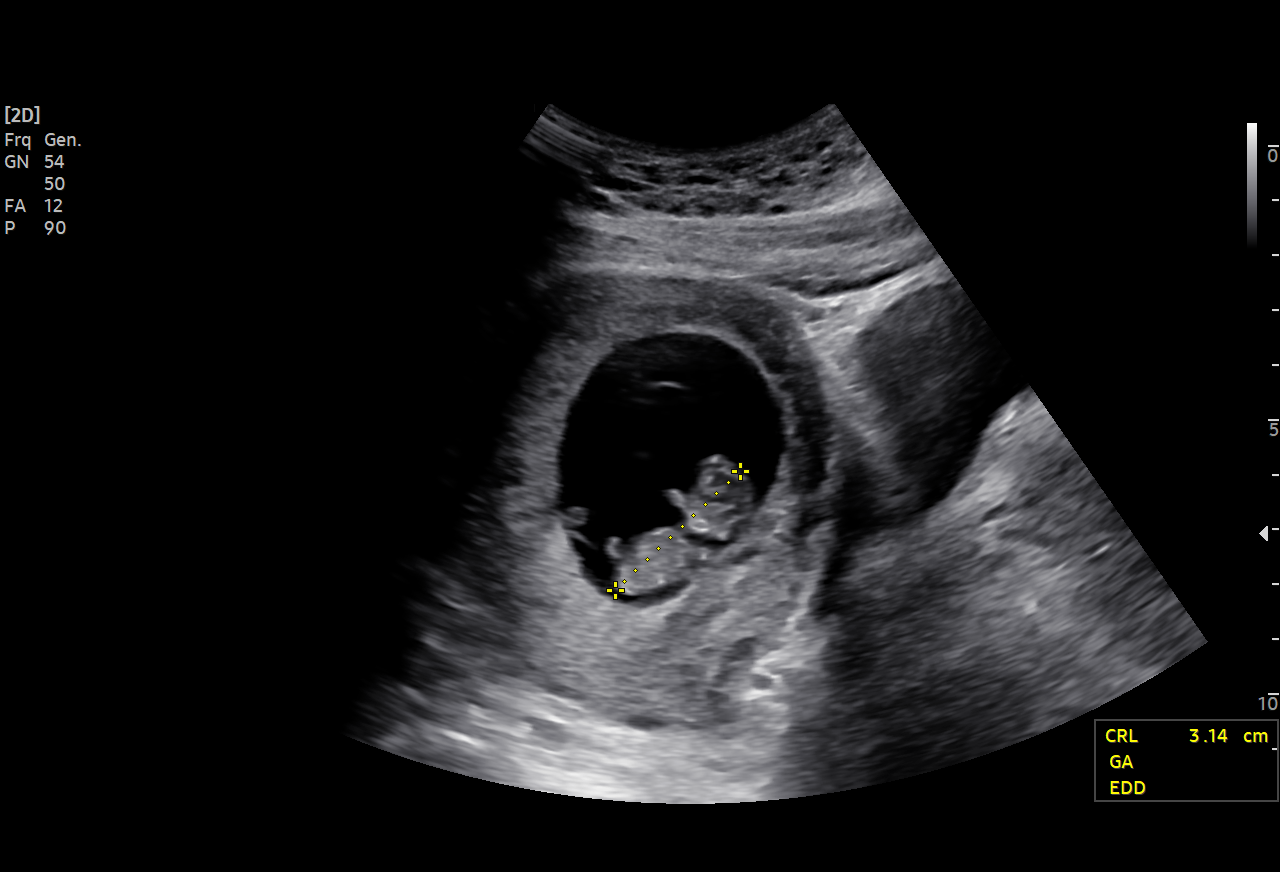
[im 3/5]
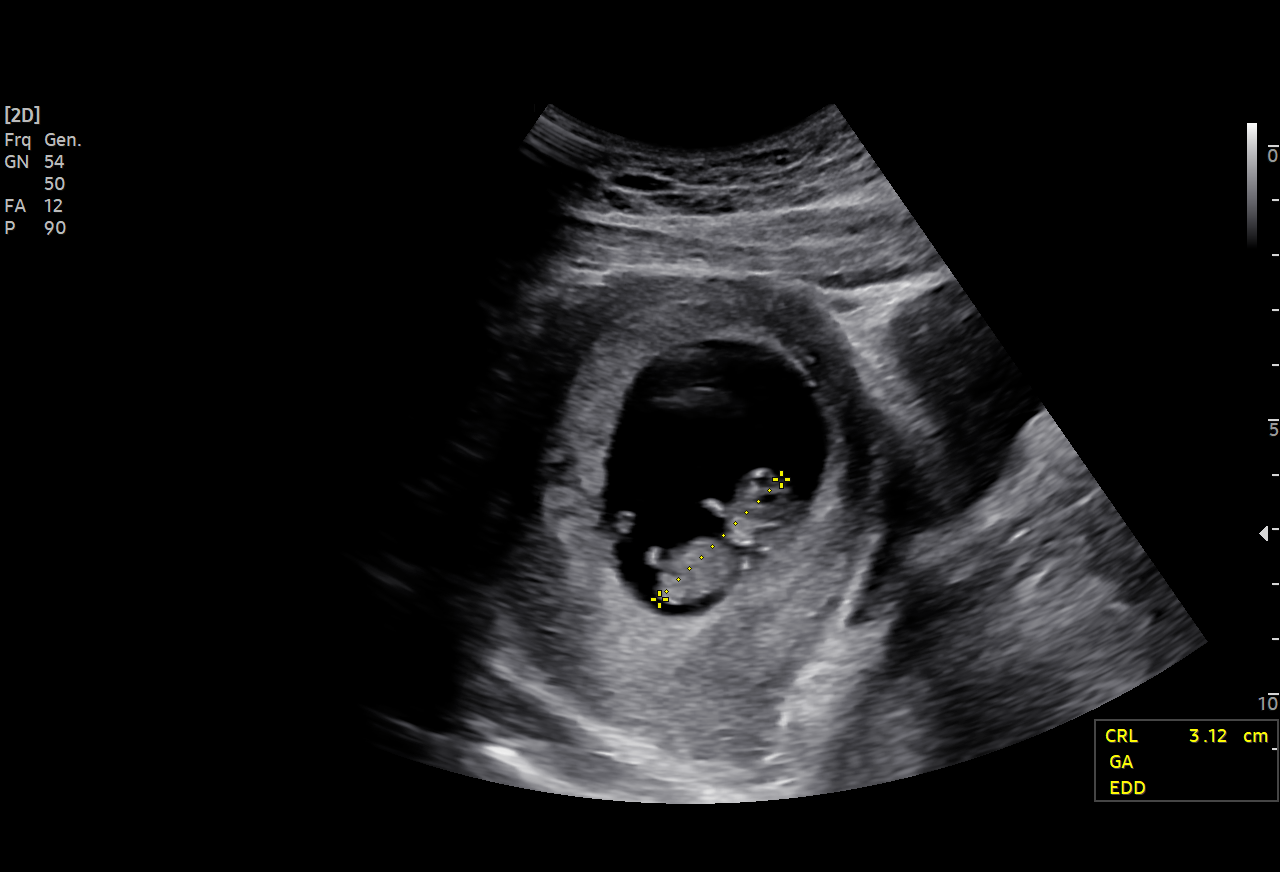
[im 4/5]
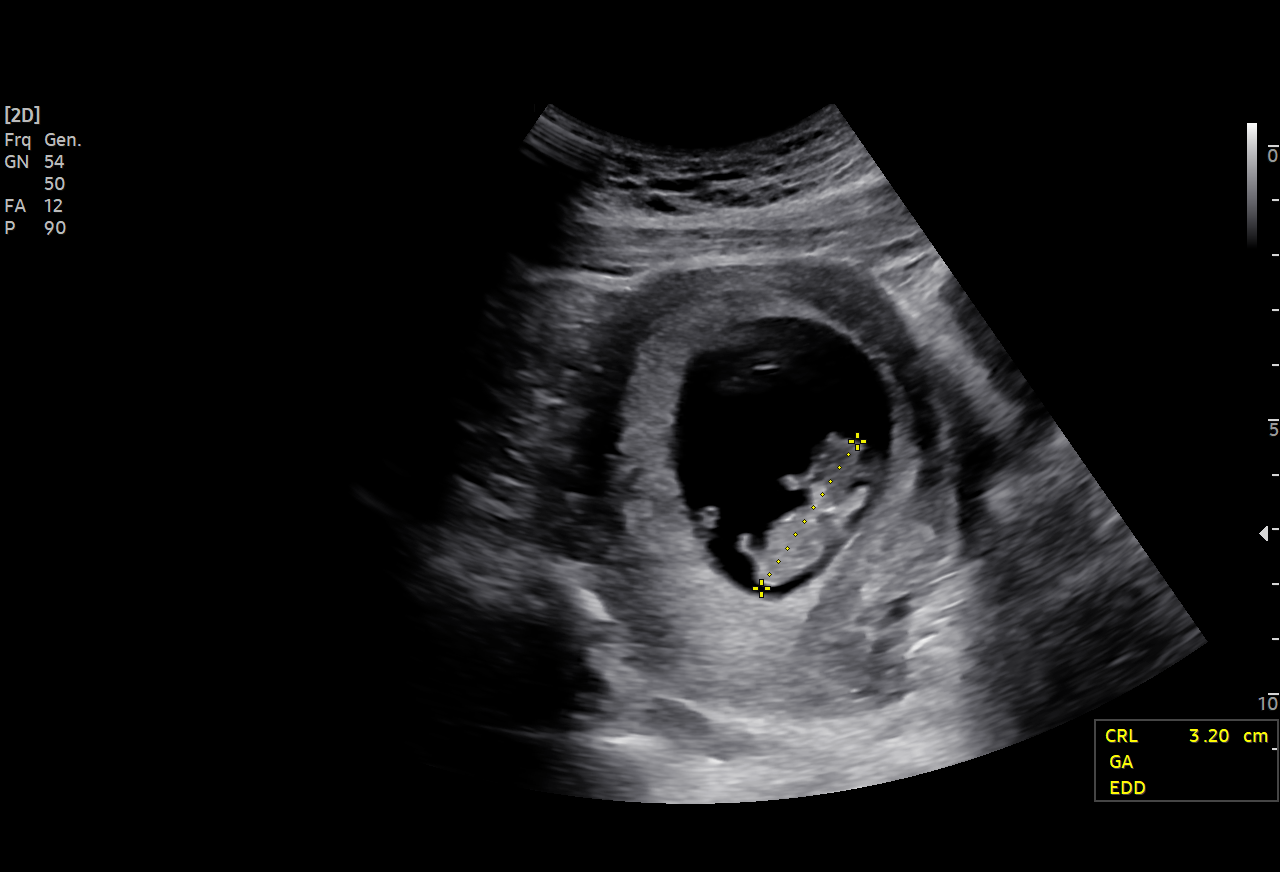
[im 5/5]
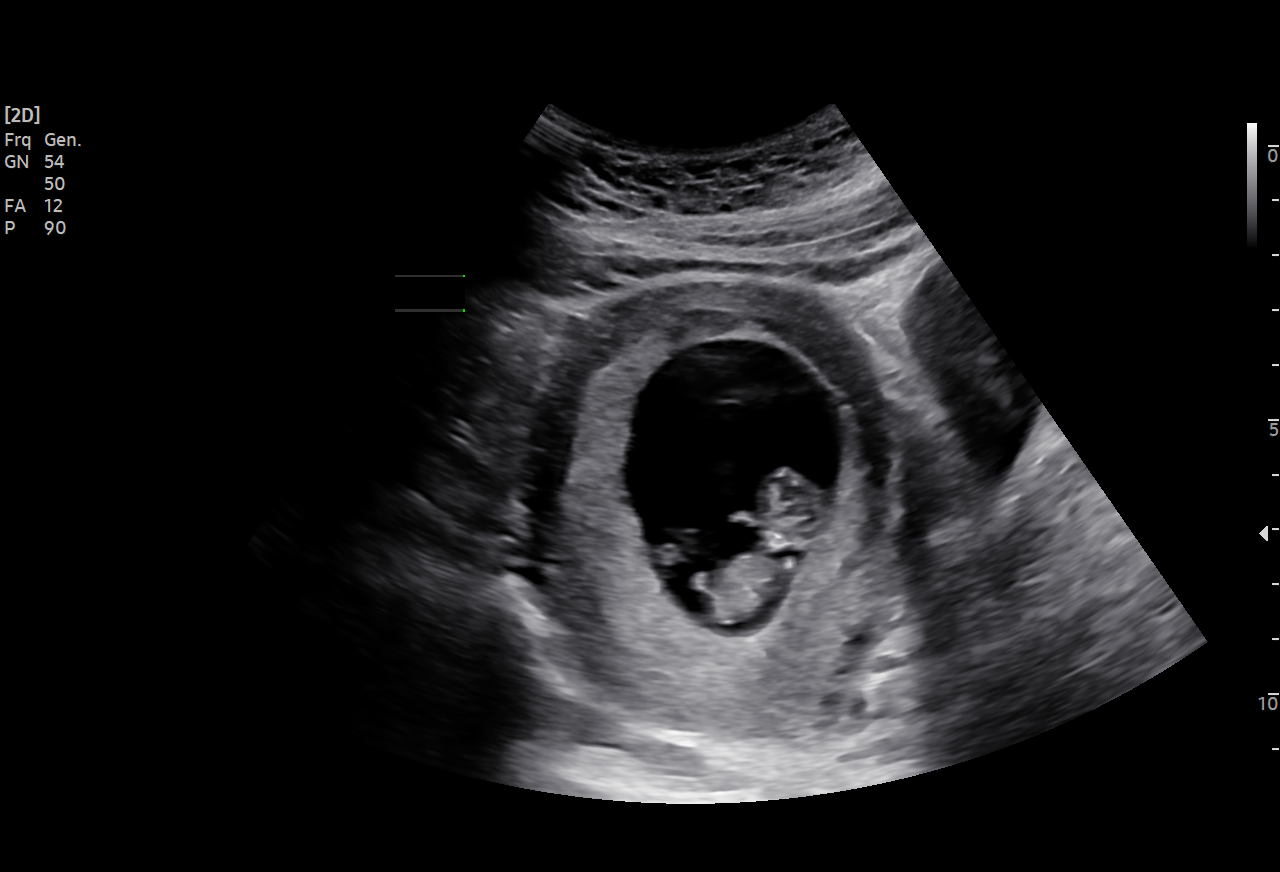

[5 of 5 positions shown; findings below may reference images not displayed]

[REDACTED]care
                                                              Healthcare

 1   [HOSPITAL]                        [address].0      [facility]DLIN MUNCHIE

Indications

 9 weeks gestation of pregnancy
Fetal Evaluation

 Num Of Fetuses:          1
 Fetal Heart Rate(bpm):   171
 Cardiac Activity:        Observed
 Fetal Lie:               [DATE]

 Comment:     Viability confirmed FHT 171
Biometry

 CRL:      31.5   mm     G. Age:  9w 6d                    EDD:   02/22/21
Gestational Age

 Best:           9w 6d     Det. By:  U/S C R L (07/26/20)       EDD:  02/22/21
Comments

 Single live IUP at 9w6d by CRL. LMP 05/17/20

## 2022-11-10 ENCOUNTER — Ambulatory Visit (INDEPENDENT_AMBULATORY_CARE_PROVIDER_SITE_OTHER): Payer: Medicaid Other | Admitting: Licensed Clinical Social Worker

## 2022-11-10 ENCOUNTER — Other Ambulatory Visit (HOSPITAL_COMMUNITY)
Admission: RE | Admit: 2022-11-10 | Discharge: 2022-11-10 | Disposition: A | Discharge status: Home / Self Care | Payer: 59 | Source: Ambulatory Visit | Attending: Certified Nurse Midwife | Admitting: Certified Nurse Midwife

## 2022-11-10 ENCOUNTER — Institutional Professional Consult (permissible substitution): Payer: Medicaid Other | Admitting: Licensed Clinical Social Worker

## 2022-11-10 ENCOUNTER — Ambulatory Visit (INDEPENDENT_AMBULATORY_CARE_PROVIDER_SITE_OTHER): Payer: Medicaid Other | Admitting: Certified Nurse Midwife

## 2022-11-10 ENCOUNTER — Encounter: Payer: Self-pay | Admitting: Certified Nurse Midwife

## 2022-11-10 VITALS — BP 103/68 | HR 80 | Wt 208.4 lb

## 2022-11-10 DIAGNOSIS — Z1339 Encounter for screening examination for other mental health and behavioral disorders: Secondary | ICD-10-CM

## 2022-11-10 DIAGNOSIS — F419 Anxiety disorder, unspecified: Secondary | ICD-10-CM | POA: Insufficient documentation

## 2022-11-10 DIAGNOSIS — Z3A16 16 weeks gestation of pregnancy: Secondary | ICD-10-CM | POA: Diagnosis not present

## 2022-11-10 DIAGNOSIS — Z8759 Personal history of other complications of pregnancy, childbirth and the puerperium: Secondary | ICD-10-CM | POA: Insufficient documentation

## 2022-11-10 DIAGNOSIS — O99340 Other mental disorders complicating pregnancy, unspecified trimester: Secondary | ICD-10-CM | POA: Insufficient documentation

## 2022-11-10 DIAGNOSIS — Z3482 Encounter for supervision of other normal pregnancy, second trimester: Secondary | ICD-10-CM | POA: Insufficient documentation

## 2022-11-10 DIAGNOSIS — J45909 Unspecified asthma, uncomplicated: Secondary | ICD-10-CM | POA: Insufficient documentation

## 2022-11-10 DIAGNOSIS — Z3480 Encounter for supervision of other normal pregnancy, unspecified trimester: Secondary | ICD-10-CM

## 2022-11-10 DIAGNOSIS — J4521 Mild intermittent asthma with (acute) exacerbation: Secondary | ICD-10-CM

## 2022-11-10 NOTE — Progress Notes (Unsigned)
History:   Kathy Davenport is a 32 y.o. G2P1001 at [redacted]w[redacted]d by LMP being seen today for her first obstetrical visit.  Her obstetrical history is significant for pregnancy induced hypertension. Patient does intend to breast feed and is still currently breastfeeding her 34 year old. She just recently moved to [facility] and request to have the remainder of her prenatal care be completed at the [facility].  Pregnancy history fully reviewed.  Patient reports  some mild life stressors like moving that are contributing to her anxiety but denies needing medication. Reports previously on Lexapro and Vistaril in previous pregnancy but states she is doing well without it at this time. She states she has a therapist that she sees weekly and that has proven to work at this time. She reports having some hypertension in a previous pregnancy. She denies currently being on medication for high blood pressure.       Marland Kitchen      HISTORY: OB History  Gravida Para Term Preterm AB Living  2 1 1  0 0 1  SAB IAB Ectopic Multiple Live Births  0 0 0 0 1    # Outcome Date GA Lbr Len/2nd Weight Sex Delivery Anes PTL Lv  2 Current           1 Term 02/14/21 [redacted]w[redacted]d 23:22 / 01:27 7 lb 5.3 oz (3.325 kg) M Vag-Spont EPI  LIV     Name: SIMONETTA, PATIENCE     Apgar1: 8  Apgar5: 9    Last pap smear was done 08/13/20 and was normal  Past Medical History:  Diagnosis Date   ADHD (attention deficit hyperactivity disorder)    Anxiety    Asthma    Depression    Hypertension    Past Surgical History:  Procedure Laterality Date   ELBOW FRACTURE SURGERY     TOOTH EXTRACTION     Family History  Problem Relation Age of Onset   Stroke Paternal Grandfather    Prostate cancer Paternal Grandfather    Stroke Paternal Grandmother    Fibromyalgia Maternal Grandmother    Pulmonary embolism Maternal Grandfather    Thyroid nodules Father    Hyperlipidemia Mother    Social History   Tobacco Use   Smoking status: Former     Types: E-cigarettes   Smokeless tobacco: Never   Tobacco comments:    vape  Vaping Use   Vaping Use: Former  Substance Use Topics   Alcohol use: Not Currently    Comment: occasionally, prior to pregnancy   Drug use: Not Currently    Types: Marijuana    Comment: not since early Jan   Allergies  Allergen Reactions   Penicillins Hives   Current Outpatient Medications on File Prior to Visit  Medication Sig Dispense Refill   acetaminophen (TYLENOL) 325 MG tablet Take 2 tablets (650 mg total) by mouth every 4 (four) hours as needed (for pain scale < 4). 60 tablet 0   albuterol (PROVENTIL) (2.5 MG/3ML) 0.083% nebulizer solution Take 2.5 mg by nebulization every 6 (six) hours as needed for wheezing or shortness of breath.     aspirin EC 81 MG tablet Take 1 tablet (81 mg total) by mouth daily. Take after 12 weeks for prevention of preeclampsia later in pregnancy 300 tablet 2   fexofenadine (ALLEGRA) 180 MG tablet Take 180 mg by mouth daily.     fexofenadine-pseudoephedrine (ALLEGRA-D) 60-120 MG 12 hr tablet Take by mouth.     fluticasone (FLONASE) 50 MCG/ACT  nasal spray Place into both nostrils daily.     fluticasone (FLOVENT HFA) 220 MCG/ACT inhaler Inhale 2 puffs into the lungs in the morning and at bedtime. 1 each 12   montelukast (SINGULAIR) 10 MG tablet TAKE 1 TABLET BY MOUTH EVERYDAY AT BEDTIME 90 tablet 1   Prenatal Vit-Fe Fumarate-FA (PRENATAL MULTIVITAMIN) TABS tablet Take 1 tablet by mouth daily at 12 noon.     Spacer/Aero-Holding Chambers (EASIVENT) inhaler Use with inhaler as instructed.     albuterol (VENTOLIN HFA) 108 (90 Base) MCG/ACT inhaler Inhale into the lungs.     benzonatate (TESSALON) 100 MG capsule Take 100 mg by mouth 3 (three) times daily as needed. (Patient not taking: Reported on 09/22/2022)     Blood Pressure Monitoring (BLOOD PRESSURE KIT) DEVI 1 kit by Does not apply route as needed. Large (Patient not taking: Reported on 11/10/2022) 1 each 0   cephALEXin (KEFLEX)  500 MG capsule Take 500 mg by mouth 3 (three) times daily. (Patient not taking: Reported on 11/10/2022)     clobetasol ointment (TEMOVATE) 0.05 % Apply 1 application topically 2 (two) times daily. Apply to affected area (Patient not taking: Reported on 11/10/2022) 30 g 0   docusate sodium (COLACE) 100 MG capsule Take 1 capsule (100 mg total) by mouth 2 (two) times daily as needed. (Patient not taking: Reported on 11/10/2022) 30 capsule 0   doxylamine, Sleep, (UNISOM) 25 MG tablet Take 25 mg by mouth at bedtime as needed. (Patient not taking: Reported on 09/22/2022)     escitalopram (LEXAPRO) 20 MG tablet Take 1 tablet (20 mg total) by mouth daily. (Patient not taking: Reported on 08/27/2022) 30 tablet 5   hydrOXYzine (VISTARIL) 25 MG capsule Take 1 capsule (25 mg total) by mouth 3 (three) times daily as needed. (Patient not taking: Reported on 11/10/2022) 30 capsule 5   ibuprofen (ADVIL) 600 MG tablet Take 1 tablet (600 mg total) by mouth every 6 (six) hours. (Patient not taking: Reported on 11/10/2022) 30 tablet 0   NIFEdipine (PROCARDIA XL) 30 MG 24 hr tablet Take 1 tablet (30 mg total) by mouth daily. (Patient not taking: Reported on 11/10/2022) 30 tablet 0   norethindrone (ORTHO MICRONOR) 0.35 MG tablet Take 1 tablet (0.35 mg total) by mouth daily. 28 tablet 11   predniSONE (DELTASONE) 20 MG tablet Take 3 tablets (60 mg total) by mouth daily with breakfast. (Patient not taking: Reported on 11/10/2022) 15 tablet 0   No current facility-administered medications on file prior to visit.    Review of Systems Pertinent items noted in HPI and remainder of comprehensive ROS otherwise negative.  Indications for ASA therapy (per UpToDate) One of the following: Previous pregnancy with preeclampsia, especially early onset and with an adverse outcome Yes Multifetal gestation No Chronic hypertension No Type 1 or 2 diabetes mellitus No Chronic kidney disease No Autoimmune disease (antiphospholipid syndrome,  systemic lupus erythematosus) No Two or more of the following: Nulliparity No Obesity (body mass index >30 kg/m2) Yes Family history of preeclampsia in mother or sister No Age ?35 years No Sociodemographic characteristics (African American race, low socioeconomic level) No Personal risk factors (eg, previous pregnancy with low birth weight or small for gestational age infant, previous adverse pregnancy outcome [eg, stillbirth], interval >10 years between pregnancies) Yes  Indications for early GDM screening (FBS, A1C, Random CBG, GTT) First-degree relative with diabetes No BMI >30kg/m2 Yes Age > 35 No Previous birth of an infant weighing ?4000 g No Gestational diabetes mellitus in  a previous pregnancy No Glycated hemoglobin ?5.7 percent (39 mmol/mol), impaired glucose tolerance, or impaired fasting glucose on previous testing No High-risk race/ethnicity (eg, African American, Latino, Native American, Asian American, Pacific Islander) No Previous stillbirth of unknown cause No Maternal birthweight > 9 lbs No History of cardiovascular disease No Hypertension or on therapy for hypertension No High-density lipoprotein cholesterol level <35 mg/dL (1.61 mmol/L) and/or a triglyceride level >250 mg/dL (0.96 mmol/L) No Polycystic ovary syndrome No Physical inactivity No Other clinical condition associated with insulin resistance (eg, severe obesity, acanthosis nigricans) No Current use of glucocorticoids No   Physical Exam:   Vitals:   11/10/22 1407  BP: 103/68  Pulse: 80  Weight: 208 lb 6.4 oz (94.5 kg)   Fetal Heart Rate (bpm): 150   General: well-developed, well-nourished female in no acute distress  Skin: normal coloration and turgor, no rashes  Neurologic: oriented, normal, negative, normal mood  Extremities: normal strength, tone, and muscle mass, ROM of all joints is normal  HEENT PERRLA, extraocular movement intact and sclera clear, anicteric  Neck supple and no masses   Cardiovascular: regular rate and rhythm  Respiratory:  no respiratory distress, normal breath sounds  Abdomen: soft, non-tender; bowel sounds normal; no masses,  no organomegaly  Pelvic: normal external genitalia, no lesions, normal vaginal mucosa, normal vaginal discharge, cervical ectropion observed and friable.  pap smear done. Exam done in the presence of a chaperone.     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 Supervision of other normal pregnancy, antepartum *** - Cytology - PAP( [place]) - Cervicovaginal ancillary only( [place]) - CBC/D/Plt+RPR+Rh+ABO+RubIgG... - Culture, OB Urine - AFP, Serum, Open Spina Bifida  2. [redacted] weeks gestation of pregnancy *** - Cytology - PAP( [place]) - Cervicovaginal ancillary only( [place]) - CBC/D/Plt+RPR+Rh+ABO+RubIgG... - Culture, OB Urine - AFP, Serum, Open Spina Bifida  3. History of gestational hypertension *** - Cytology - PAP( [place]) - Cervicovaginal ancillary only( [place]) - CBC/D/Plt+RPR+Rh+ABO+RubIgG... - Culture, OB Urine - AFP, Serum, Open Spina Bifida  4. Anxiety disorder affecting pregnancy, antepartum *** - Cytology - PAP( [place]) - Cervicovaginal ancillary only( [place]) - CBC/D/Plt+RPR+Rh+ABO+RubIgG... - Culture, OB Urine - AFP, Serum, Open Spina Bifida  5. Mild intermittent asthma with acute exacerbation ***   Initial labs drawn. Continue prenatal vitamins. Problem list reviewed and updated. Genetic Screening discussed, {Blank  multiple:19196::"Panorama","Horizon"}: {requests/ordered/declines:14581}. Ultrasound discussed; fetal anatomic survey: {Planned/scheduled/na:14534}. Anticipatory guidance about prenatal visits given including labs, ultrasounds, and testing. Weight gain recommendations per IOM guidelines reviewed: underweight/BMI 18.5 or less > 28 - 40 lbs; normal weight/BMI 18.5 - 24.9 > 25 - 35 lbs; overweight/BMI 25 - 29.9 > 15 - 25 lbs; obese/BMI  30 or more > 11 - 20 lbs. Discussed usage of the Babyscripts app for more information about pregnancy, and to track blood pressures. Also discussed usage of virtual visits as additional source of managing and completing prenatal visits.  Patient was encouraged to use MyChart to review results, send requests, and have questions addressed.   The nature of [facility] with multiple MDs and Advanced Practice Providers was explained to patient; also emphasized that residents, students are part of our team. Routine obstetric precautions reviewed. Encouraged to seek out care at our office or emergency room [facility] preferred) for urgent and/or emergent concerns. Return in about 4 weeks (around 12/08/2022) for LOB.     Jaynie Collins, MD, FACOG Obstetrician & Gynecologist, [facility] [employer], [facility]

## 2022-11-10 NOTE — Progress Notes (Unsigned)
Pt presents for NOB visit. Pt c/o round ligament pain. Pt has concerns about lift toddler during pregnancy. No other concerns   Pt recently moved to [place] and may want to be seen at [facility] office.

## 2022-11-11 LAB — CERVICOVAGINAL ANCILLARY ONLY
Chlamydia: NEGATIVE
Comment: NEGATIVE
Comment: NEGATIVE
Comment: NORMAL
Neisseria Gonorrhea: NEGATIVE
Trichomonas: NEGATIVE

## 2022-11-11 LAB — CBC/D/PLT+RPR+RH+ABO+RUBIGG...
Antibody Screen: NEGATIVE
Basophils Absolute: 0.1 10*3/uL (ref 0.0–0.2)
Basos: 1 %
EOS (ABSOLUTE): 0.4 10*3/uL (ref 0.0–0.4)
Eos: 4 %
HCV Ab: NONREACTIVE
HIV Screen 4th Generation wRfx: NONREACTIVE
Hematocrit: 36.9 % (ref 34.0–46.6)
Hemoglobin: 12.2 g/dL (ref 11.1–15.9)
Hepatitis B Surface Ag: NEGATIVE
Immature Grans (Abs): 0 10*3/uL (ref 0.0–0.1)
Immature Granulocytes: 0 %
Lymphocytes Absolute: 3.3 10*3/uL — ABNORMAL HIGH (ref 0.7–3.1)
Lymphs: 33 %
MCH: 31 pg (ref 26.6–33.0)
MCHC: 33.1 g/dL (ref 31.5–35.7)
MCV: 94 fL (ref 79–97)
Monocytes Absolute: 0.5 10*3/uL (ref 0.1–0.9)
Monocytes: 5 %
Neutrophils Absolute: 5.7 10*3/uL (ref 1.4–7.0)
Neutrophils: 57 %
Platelets: 268 10*3/uL (ref 150–450)
RBC: 3.94 x10E6/uL (ref 3.77–5.28)
RDW: 13.3 % (ref 11.7–15.4)
RPR Ser Ql: NONREACTIVE
Rh Factor: POSITIVE
Rubella Antibodies, IGG: 2.76 index (ref >0.99)
WBC: 10.1 10*3/uL (ref 3.4–10.8)

## 2022-11-11 LAB — HCV INTERPRETATION

## 2022-11-11 LAB — CYTOLOGY - PAP: Diagnosis: NEGATIVE

## 2022-11-11 NOTE — BH Specialist Note (Signed)
Integrated Behavioral Health Initial In-Person Visit  MRN: [id number] Name: Kathy Davenport  Number of Integrated Behavioral Health Clinician visits: 1 Session Start time:   245pm Session End time: 300pm Total time in minutes: 15 mins in person at [facility]   Types of Service: General Behavioral Integrated Care (BHI)  Interpretor:No. Interpretor Name and Language: none   Warm Hand Off Completed.        Subjective: Kathy Davenport is a 32 y.o. female accompanied by n/a Patient was referred by Rhunette Croft CNM for new ob intro. Patient reports the following symptoms/concerns: no reported concerns  Duration of problem: n/a; Severity of problem: mild  Objective: Mood: good  and Affect: Appropriate Risk of harm to self or others: No plan to harm self or others  Life Context: Family and Social: Possibly relocating to [employer] [place]  School/Work: n/a Self-Care: rest Life Changes: new pregnancy  Patient and/or Family's Strengths/Protective Factors: Concrete supports in place (healthy food, safe environments, etc.)   Advise Ms. Palmeri of [facility] locations closer to [place], [place]. New ob intro completed.  Gwyndolyn Saxon, LCSW

## 2022-11-12 LAB — AFP, SERUM, OPEN SPINA BIFIDA
AFP MoM: 0.92
AFP Value: 25.3 ng/mL
Gest. Age on Collection Date: 16 weeks
Maternal Age At EDD: 34.3 yr
OSBR Risk 1 IN: 10000
Test Results:: NEGATIVE
Weight: 208 [lb_av]

## 2022-11-12 LAB — CULTURE, OB URINE

## 2022-11-12 LAB — URINE CULTURE, OB REFLEX

## 2022-11-19 LAB — PANORAMA PRENATAL TEST FULL PANEL:PANORAMA TEST PLUS 5 ADDITIONAL MICRODELETIONS: FETAL FRACTION: 9.7

## 2022-11-19 LAB — HORIZON CUSTOM

## 2022-11-26 ENCOUNTER — Encounter: Payer: Self-pay | Admitting: *Deleted

## 2022-11-30 ENCOUNTER — Ambulatory Visit: Payer: 59 | Attending: Obstetrics and Gynecology

## 2022-11-30 ENCOUNTER — Ambulatory Visit: Payer: 59 | Admitting: *Deleted

## 2022-11-30 ENCOUNTER — Encounter: Payer: Self-pay | Admitting: *Deleted

## 2022-11-30 ENCOUNTER — Other Ambulatory Visit: Payer: Self-pay | Admitting: *Deleted

## 2022-11-30 VITALS — BP 106/63 | HR 67

## 2022-11-30 DIAGNOSIS — Z8759 Personal history of other complications of pregnancy, childbirth and the puerperium: Secondary | ICD-10-CM

## 2022-11-30 DIAGNOSIS — O09292 Supervision of pregnancy with other poor reproductive or obstetric history, second trimester: Secondary | ICD-10-CM | POA: Diagnosis not present

## 2022-11-30 DIAGNOSIS — Z3A19 19 weeks gestation of pregnancy: Secondary | ICD-10-CM | POA: Insufficient documentation

## 2022-11-30 DIAGNOSIS — O99512 Diseases of the respiratory system complicating pregnancy, second trimester: Secondary | ICD-10-CM | POA: Insufficient documentation

## 2022-11-30 DIAGNOSIS — Z3480 Encounter for supervision of other normal pregnancy, unspecified trimester: Secondary | ICD-10-CM

## 2022-11-30 DIAGNOSIS — J45909 Unspecified asthma, uncomplicated: Secondary | ICD-10-CM | POA: Diagnosis not present

## 2022-11-30 DIAGNOSIS — Z363 Encounter for antenatal screening for malformations: Secondary | ICD-10-CM | POA: Insufficient documentation

## 2022-11-30 DIAGNOSIS — O99212 Obesity complicating pregnancy, second trimester: Secondary | ICD-10-CM | POA: Insufficient documentation

## 2022-12-01 ENCOUNTER — Telehealth: Payer: Self-pay

## 2022-12-01 NOTE — Telephone Encounter (Signed)
Attempted to reach patient regarding message left with answering service Left voicemail for patient to call office or send mychart message with details regarding appointment needs.   

## 2022-12-22 ENCOUNTER — Ambulatory Visit (INDEPENDENT_AMBULATORY_CARE_PROVIDER_SITE_OTHER): Payer: 59 | Admitting: Certified Nurse Midwife

## 2022-12-22 VITALS — BP 98/66 | HR 93 | Wt 213.3 lb

## 2022-12-22 DIAGNOSIS — Z3482 Encounter for supervision of other normal pregnancy, second trimester: Secondary | ICD-10-CM

## 2022-12-22 DIAGNOSIS — Z3A22 22 weeks gestation of pregnancy: Secondary | ICD-10-CM | POA: Diagnosis not present

## 2022-12-22 LAB — POCT URINALYSIS DIPSTICK OB
Bilirubin, UA: NEGATIVE
Blood, UA: NEGATIVE
Glucose, UA: NEGATIVE
Ketones, UA: NEGATIVE
Leukocytes, UA: NEGATIVE
Nitrite, UA: NEGATIVE
POC,PROTEIN,UA: NEGATIVE
Spec Grav, UA: 1.02 (ref 1.010–1.025)
Urobilinogen, UA: 0.2 E.U./dL
pH, UA: 5 (ref 5.0–8.0)

## 2022-12-22 NOTE — Progress Notes (Signed)
TRANSFER IN OB HISTORY AND PHYSICAL  SUBJECTIVE:       Kathy Davenport is a 32 y.o. G90P1001 female, Patient's last menstrual period was 07/18/2022., Estimated Date of Delivery: 04/24/23, [redacted]w[redacted]d, presents today for Transition of Prenatal Care. EPIC data migration from outside records is accomplished today. Complaints today include: none   Relationship:Married Living with spouse and son   Gynecologic History Patient's last menstrual period was 07/18/2022. Normal Contraception: none Last Pap: 11/10/2022. Results were: normal  Obstetric History OB History  Gravida Para Term Preterm AB Living  2 1 1     1   SAB IAB Ectopic Multiple Live Births        0 1    # Outcome Date GA Lbr Len/2nd Weight Sex Delivery Anes PTL Lv  2 Current           1 Term 02/14/21 [redacted]w[redacted]d 23:22 / 01:27 7 lb 5.3 oz (3.325 kg) M Vag-Spont EPI  LIV    Past Medical History:  Diagnosis Date   ADHD (attention deficit hyperactivity disorder)    ADHD (attention deficit hyperactivity disorder)    Was on adderall at beginning of pregnancy, stopped when found out she was pregnant   Anxiety    Anxiety    Was on ativan at beginning of pregnancy, tapered and now completely off     10/30/2020  -pt reports 2-3 panic attacks per week, reporting intense fear, heart racing, withdrawing from people around her, shortness of breath  -pt has used hydroxyzine in the past with success  -Atarax RX given for PRN use  -pt currently seeing therapist weekly, not connected with psychiatrist d/t insurance change  -re   Asthma    Depression    Depression 08/07/2020   On lexapro, was on ativan prior to pregnancy, tapered when she found out  Doing well now, recommend stay on lexapro for now   Hypertension    Major depressive disorder, recurrent, 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 60 tablet 0   albuterol (PROVENTIL) (2.5 MG/3ML) 0.083% nebulizer solution Take 2.5 mg by nebulization every 6 (six) hours as needed for wheezing or shortness of breath.     aspirin EC 81 MG tablet Take 1 tablet (81 mg total) by mouth daily. Take after 12 weeks for prevention of preeclampsia later in pregnancy 300 tablet 2   fexofenadine (ALLEGRA) 180 MG tablet Take 180 mg by mouth daily.     fexofenadine-pseudoephedrine (ALLEGRA-D) 60-120 MG 12 hr tablet Take by mouth.     fluticasone (FLONASE) 50 MCG/ACT nasal spray Place into both nostrils daily.     fluticasone (FLOVENT HFA) 220 MCG/ACT inhaler Inhale 2 puffs into the lungs in the morning and at bedtime. 1 each 12   hydrOXYzine (VISTARIL) 25 MG capsule Take 1 capsule (25 mg total) by mouth 3 (three) times daily as needed. 30 capsule 5   ibuprofen (ADVIL) 600 MG tablet Take 1 tablet (600 mg total) by mouth every 6 (six) hours. 30 tablet 0   montelukast (SINGULAIR) 10 MG tablet TAKE 1 TABLET BY MOUTH EVERYDAY AT BEDTIME 90 tablet 1  NIFEdipine (PROCARDIA XL) 30 MG 24 hr tablet Take 1 tablet (30 mg total) by mouth daily. 30 tablet 0   predniSONE (DELTASONE) 20 MG tablet Take 3 tablets (60 mg total) by mouth daily with breakfast. 15 tablet 0   Prenatal Vit-Fe Fumarate-FA (PRENATAL MULTIVITAMIN) TABS tablet Take 1 tablet by mouth daily at 12 noon.     Spacer/Aero-Holding Chambers (EASIVENT) inhaler Use with inhaler as instructed.     albuterol (VENTOLIN HFA) 108 (90 Base) MCG/ACT inhaler Inhale into the lungs.     benzonatate (TESSALON) 100 MG capsule Take 100 mg by mouth 3 (three) times daily as needed. (Patient not taking: Reported on 09/22/2022)     Blood Pressure Monitoring (BLOOD PRESSURE KIT) DEVI 1 kit by Does not apply route as needed. Large (Patient not  taking: Reported on 11/10/2022) 1 each 0   cephALEXin (KEFLEX) 500 MG capsule Take 500 mg by mouth 3 (three) times daily. (Patient not taking: Reported on 11/10/2022)     clobetasol ointment (TEMOVATE) 0.05 % Apply 1 application topically 2 (two) times daily. Apply to affected area (Patient not taking: Reported on 11/10/2022) 30 g 0   docusate sodium (COLACE) 100 MG capsule Take 1 capsule (100 mg total) by mouth 2 (two) times daily as needed. (Patient not taking: Reported on 11/10/2022) 30 capsule 0   doxylamine, Sleep, (UNISOM) 25 MG tablet Take 25 mg by mouth at bedtime as needed. (Patient not taking: Reported on 09/22/2022)     escitalopram (LEXAPRO) 20 MG tablet Take 1 tablet (20 mg total) by mouth daily. (Patient not taking: Reported on 08/27/2022) 30 tablet 5   norethindrone (ORTHO MICRONOR) 0.35 MG tablet Take 1 tablet (0.35 mg total) by mouth daily. 28 tablet 11   No current facility-administered medications on file prior to visit.    Allergies  Allergen Reactions   Penicillins Hives    Social History   Socioeconomic History   Marital status: Married    Spouse name: Not on file   Number of children: Not on file   Years of education: Not on file   Highest education level: Not on file  Occupational History   Not on file  Tobacco Use   Smoking status: Former    Types: E-cigarettes   Smokeless tobacco: Never   Tobacco comments:    vape  Vaping Use   Vaping Use: Former   Substances: Nicotine  Substance and Sexual Activity   Alcohol use: Not Currently    Comment: occasionally, prior to pregnancy   Drug use: Not Currently    Types: Marijuana    Comment: not since early Jan   Sexual activity: Yes    Partners: Male    Birth control/protection: None  Other Topics Concern   Not on file  Social History Narrative   Not on file   Social Determinants of Health   Financial Resource Strain: Not on file  Food Insecurity: Not on file  Transportation Needs: Not on file  Physical  Activity: Not on file  Stress: Not on file  Social Connections: Not on file  Intimate Partner Violence: Not on file    Family History  Problem Relation Age of Onset   Stroke Paternal Grandfather    Prostate cancer Paternal Grandfather    Stroke Paternal Grandmother    Fibromyalgia Maternal Grandmother    Pulmonary embolism Maternal Grandfather    Thyroid nodules Father    Hyperlipidemia Mother     The following portions of the patient's history were  reviewed and updated as appropriate: allergies, current medications, past OB history, past medical history, past surgical history, past family history, past social history, and problem list.    OBJECTIVE: Initial Physical Exam (New OB)  GENERAL APPEARANCE: alert, well appearing, in no apparent distress, oriented to person, place and time, overweight HEAD: normocephalic, atraumatic MOUTH: mucous membranes moist, pharynx normal without lesions THYROID: no thyromegaly or masses present BREASTS: not examined LUNGS: clear to auscultation, no wheezes, rales or rhonchi, symmetric air entry HEART: regular rate and rhythm, no murmurs ABDOMEN: soft, nontender, nondistended, no abnormal masses, no epigastric pain, obese, fundus soft, nontender 22 weeks size, and FHT present EXTREMITIES: no redness or tenderness in the calves or thighs, no edema, no limitation in range of motion, intact peripheral pulses SKIN: normal coloration and turgor, no rashes LYMPH NODES: no adenopathy palpable NEUROLOGIC: alert, oriented, normal speech, no focal findings or movement disorder noted  PELVIC EXAM deferred, pap not due, tested pelvis  ASSESSMENT: Normal pregnancy  PLAN: Prenatal care Follow up 3 wks for ROB.   Doreene Burke, CNM

## 2023-01-14 ENCOUNTER — Ambulatory Visit (INDEPENDENT_AMBULATORY_CARE_PROVIDER_SITE_OTHER): Payer: 59

## 2023-01-14 VITALS — BP 109/70 | HR 97 | Wt 215.0 lb

## 2023-01-14 DIAGNOSIS — F419 Anxiety disorder, unspecified: Secondary | ICD-10-CM

## 2023-01-14 DIAGNOSIS — Z3480 Encounter for supervision of other normal pregnancy, unspecified trimester: Secondary | ICD-10-CM

## 2023-01-14 DIAGNOSIS — Z3A25 25 weeks gestation of pregnancy: Secondary | ICD-10-CM

## 2023-01-14 DIAGNOSIS — O09292 Supervision of pregnancy with other poor reproductive or obstetric history, second trimester: Secondary | ICD-10-CM

## 2023-01-14 DIAGNOSIS — Z8759 Personal history of other complications of pregnancy, childbirth and the puerperium: Secondary | ICD-10-CM

## 2023-01-14 MED ORDER — HYDROXYZINE PAMOATE 25 MG PO CAPS: 25.0000 mg | ORAL_CAPSULE | Freq: Three times a day (TID) | ORAL | 5 refills | Status: AC | PRN

## 2023-01-14 MED ORDER — ESCITALOPRAM OXALATE 10 MG PO TABS: 10.0000 mg | ORAL_TABLET | Freq: Every day | ORAL | 6 refills | Status: DC

## 2023-01-14 NOTE — Assessment & Plan Note (Addendum)
Prepared for 1 hour GTT, third trimester labs, and Tdap at next visit. Discussed r/b/a of SSRIs in pregnancy for treating anxiety. She has used escitalopram in the past and had good results. We discussed starting back today at 10 mg daily, and she is in agreement. Rx provided. Reviewed safety of intermittent use of hydroxyzine in pregnancy, refill provided. Reviewed travel precautions, lifting of toddler in pregnancy, comfort measures for hemorrhoids and use of witch hazel.  Reviewed red flag warning signs anticipatory guidance for upcoming prenatal care.

## 2023-01-14 NOTE — Progress Notes (Signed)
    Return Prenatal Note   Assessment/Plan   Plan  32 y.o. G2P1001 at [redacted]w[redacted]d presents for follow-up OB visit. Reviewed prenatal record including previous visit note.  History of gestational hypertension Normotensive today. Continues to take daily LDASA. Growth ultrasound scheduled with MFM on 7/29.  Supervision of other normal pregnancy, antepartum Prepared for 1 hour GTT, third trimester labs, and Tdap at next visit. Discussed r/b/a of SSRIs in pregnancy for treating anxiety. She has used escitalopram in the past and had good results. We discussed starting back today at 10 mg daily, and she is in agreement. Rx provided. Reviewed safety of intermittent use of hydroxyzine in pregnancy, refill provided. Reviewed travel precautions, lifting of toddler in pregnancy, comfort measures for hemorrhoids and use of witch hazel.  Reviewed red flag warning signs anticipatory guidance for upcoming prenatal care.    No orders of the defined types were placed in this encounter.  Return in about 3 weeks (around 02/04/2023) for ROB with 1 hour glucola.   Future Appointments  Date Time Provider Department Center  01/25/2023  9:15 AM [facility] NURSE [facility]  01/25/2023  9:30 AM [facility] US3 [facility]  02/04/2023  8:40 AM [facility] LAB [facility] None  02/04/2023  8:55 AM Hildred Laser, MD [facility] None    For next visit:  ROB with 1 hour gluocla, third trimester labs, and Tdap     Subjective   32 y.o. G2P1001 at [redacted]w[redacted]d presents for this follow-up prenatal visit.  Patient has questions regarding increasing anxiety, hemorrhoid care (has blood in stool), possible travel during pregnancy.  Patient reports: Movement: Present Contractions: Not present  Objective   Flow sheet Vitals: Pulse Rate: 97 BP: 109/70 Fundal Height: 25 cm Fetal Heart Rate (bpm): 135 Total weight gain: Not found.  General Appearance  No acute distress, well appearing, and well nourished Pulmonary   Normal work of  breathing Neurologic   Alert and oriented to person, place, and time Psychiatric   Mood and affect within normal limits  Lindalou Hose Itzamara Casas, CNM  07/18/249:05 AM

## 2023-01-14 NOTE — Assessment & Plan Note (Addendum)
Normotensive today. Continues to take daily LDASA. Growth ultrasound scheduled with MFM on 7/29.

## 2023-01-25 ENCOUNTER — Ambulatory Visit: Payer: 59 | Attending: Obstetrics and Gynecology

## 2023-01-25 ENCOUNTER — Ambulatory Visit: Payer: 59 | Admitting: *Deleted

## 2023-01-25 ENCOUNTER — Encounter: Payer: Self-pay | Admitting: *Deleted

## 2023-01-25 ENCOUNTER — Other Ambulatory Visit: Payer: Self-pay | Admitting: *Deleted

## 2023-01-25 VITALS — BP 105/60 | HR 107

## 2023-01-25 DIAGNOSIS — Z362 Encounter for other antenatal screening follow-up: Secondary | ICD-10-CM | POA: Insufficient documentation

## 2023-01-25 DIAGNOSIS — O09292 Supervision of pregnancy with other poor reproductive or obstetric history, second trimester: Secondary | ICD-10-CM | POA: Insufficient documentation

## 2023-01-25 DIAGNOSIS — O99512 Diseases of the respiratory system complicating pregnancy, second trimester: Secondary | ICD-10-CM

## 2023-01-25 DIAGNOSIS — O99213 Obesity complicating pregnancy, third trimester: Secondary | ICD-10-CM

## 2023-01-25 DIAGNOSIS — O99212 Obesity complicating pregnancy, second trimester: Secondary | ICD-10-CM | POA: Diagnosis not present

## 2023-01-25 DIAGNOSIS — Z3480 Encounter for supervision of other normal pregnancy, unspecified trimester: Secondary | ICD-10-CM

## 2023-01-25 DIAGNOSIS — Z3689 Encounter for other specified antenatal screening: Secondary | ICD-10-CM

## 2023-01-25 DIAGNOSIS — Z3A27 27 weeks gestation of pregnancy: Secondary | ICD-10-CM | POA: Diagnosis not present

## 2023-01-25 DIAGNOSIS — E669 Obesity, unspecified: Secondary | ICD-10-CM | POA: Diagnosis not present

## 2023-01-25 DIAGNOSIS — Z8759 Personal history of other complications of pregnancy, childbirth and the puerperium: Secondary | ICD-10-CM | POA: Insufficient documentation

## 2023-01-25 DIAGNOSIS — J45909 Unspecified asthma, uncomplicated: Secondary | ICD-10-CM | POA: Diagnosis not present

## 2023-02-03 ENCOUNTER — Other Ambulatory Visit: Payer: Self-pay

## 2023-02-03 DIAGNOSIS — Z3A28 28 weeks gestation of pregnancy: Secondary | ICD-10-CM

## 2023-02-04 ENCOUNTER — Other Ambulatory Visit: Payer: 59

## 2023-02-04 ENCOUNTER — Encounter: Payer: Self-pay | Admitting: Obstetrics and Gynecology

## 2023-02-04 ENCOUNTER — Ambulatory Visit (INDEPENDENT_AMBULATORY_CARE_PROVIDER_SITE_OTHER): Payer: 59 | Admitting: Obstetrics and Gynecology

## 2023-02-04 VITALS — BP 104/71 | HR 70 | Wt 222.6 lb

## 2023-02-04 DIAGNOSIS — O26893 Other specified pregnancy related conditions, third trimester: Secondary | ICD-10-CM

## 2023-02-04 DIAGNOSIS — O99210 Obesity complicating pregnancy, unspecified trimester: Secondary | ICD-10-CM

## 2023-02-04 DIAGNOSIS — O26899 Other specified pregnancy related conditions, unspecified trimester: Secondary | ICD-10-CM

## 2023-02-04 DIAGNOSIS — Z23 Encounter for immunization: Secondary | ICD-10-CM

## 2023-02-04 DIAGNOSIS — R0602 Shortness of breath: Secondary | ICD-10-CM

## 2023-02-04 DIAGNOSIS — Z8759 Personal history of other complications of pregnancy, childbirth and the puerperium: Secondary | ICD-10-CM

## 2023-02-04 DIAGNOSIS — Z3480 Encounter for supervision of other normal pregnancy, unspecified trimester: Secondary | ICD-10-CM

## 2023-02-04 DIAGNOSIS — Z3A28 28 weeks gestation of pregnancy: Secondary | ICD-10-CM

## 2023-02-04 DIAGNOSIS — F419 Anxiety disorder, unspecified: Secondary | ICD-10-CM

## 2023-02-04 DIAGNOSIS — R102 Pelvic and perineal pain: Secondary | ICD-10-CM

## 2023-02-04 DIAGNOSIS — O99213 Obesity complicating pregnancy, third trimester: Secondary | ICD-10-CM | POA: Insufficient documentation

## 2023-02-04 DIAGNOSIS — O99343 Other mental disorders complicating pregnancy, third trimester: Secondary | ICD-10-CM

## 2023-02-04 DIAGNOSIS — E669 Obesity, unspecified: Secondary | ICD-10-CM

## 2023-02-04 NOTE — Progress Notes (Signed)
ROB: Patient is a 32 y.o. G2P1001 at [redacted]w[redacted]d who presents for routine OB care.  Pregnancy is complicated by Obesity in pregnancy; History of gestational hypertension; Supervision of other normal pregnancy, antepartum; and Asthma on their problem list.   Patient has complaints of constant pain in groin and pelvic area, mostly on right side.  Makes walking more difficult.  Pain is 6/10.  Also noting some increase in SOB and heart flutters. Does note some issue with anxiety, currently on Atarax but this feels different.  For 28 week labs today.  Breastfeeding  undecided, but likely will. May also supplement with formula.  Notes she breastfed last child for 2 years, just recently weaned. desires undecided method for contraception. For Tdap today, signed blood consent. Plans for epidural for pain management.    Also has concerns about the current size of her baby. Last [place] growth was 83%ile.  I discussed that later in pregnancy she may be due for another growth scan (around 34-36 weeks due to BMI likely being 40 by that time.  Can reassess growth then. RTC in 2 weeks.

## 2023-02-04 NOTE — Progress Notes (Signed)
ROB [redacted]w[redacted]d: She is doing well. She reports good fetal movement. GTT/TDAP/BTC done today. She has no new concerns

## 2023-02-05 ENCOUNTER — Other Ambulatory Visit (HOSPITAL_BASED_OUTPATIENT_CLINIC_OR_DEPARTMENT_OTHER): Payer: Self-pay

## 2023-02-05 ENCOUNTER — Encounter: Payer: Self-pay | Admitting: Certified Nurse Midwife

## 2023-02-05 ENCOUNTER — Other Ambulatory Visit: Payer: Self-pay | Admitting: Certified Nurse Midwife

## 2023-02-05 ENCOUNTER — Other Ambulatory Visit: Payer: Self-pay

## 2023-02-05 DIAGNOSIS — F419 Anxiety disorder, unspecified: Secondary | ICD-10-CM

## 2023-02-05 MED ORDER — FUSION PLUS PO CAPS
1.0000 | ORAL_CAPSULE | Freq: Every day | ORAL | 6 refills | Status: DC
  Filled 2023-02-05: qty 30, 30d supply, fill #0

## 2023-02-06 ENCOUNTER — Other Ambulatory Visit (HOSPITAL_BASED_OUTPATIENT_CLINIC_OR_DEPARTMENT_OTHER): Payer: Self-pay

## 2023-02-08 DIAGNOSIS — D649 Anemia, unspecified: Secondary | ICD-10-CM | POA: Insufficient documentation

## 2023-02-18 ENCOUNTER — Encounter: Payer: 59 | Admitting: Obstetrics

## 2023-02-21 ENCOUNTER — Observation Stay
Admission: EM | Admit: 2023-02-21 | Discharge: 2023-02-21 | Disposition: A | Discharge status: Home / Self Care | Payer: 59 | Attending: Certified Nurse Midwife | Admitting: Certified Nurse Midwife

## 2023-02-21 ENCOUNTER — Encounter: Payer: Self-pay | Admitting: Obstetrics and Gynecology

## 2023-02-21 ENCOUNTER — Other Ambulatory Visit: Payer: Self-pay

## 2023-02-21 DIAGNOSIS — O9A213 Injury, poisoning and certain other consequences of external causes complicating pregnancy, third trimester: Secondary | ICD-10-CM | POA: Diagnosis not present

## 2023-02-21 DIAGNOSIS — Z7982 Long term (current) use of aspirin: Secondary | ICD-10-CM | POA: Insufficient documentation

## 2023-02-21 DIAGNOSIS — Z3480 Encounter for supervision of other normal pregnancy, unspecified trimester: Principal | ICD-10-CM

## 2023-02-21 DIAGNOSIS — Z3A31 31 weeks gestation of pregnancy: Secondary | ICD-10-CM | POA: Insufficient documentation

## 2023-02-21 DIAGNOSIS — W19XXXD Unspecified fall, subsequent encounter: Secondary | ICD-10-CM | POA: Diagnosis not present

## 2023-02-21 DIAGNOSIS — R102 Pelvic and perineal pain: Secondary | ICD-10-CM | POA: Insufficient documentation

## 2023-02-21 DIAGNOSIS — Z79899 Other long term (current) drug therapy: Secondary | ICD-10-CM | POA: Diagnosis not present

## 2023-02-21 DIAGNOSIS — O26893 Other specified pregnancy related conditions, third trimester: Secondary | ICD-10-CM | POA: Diagnosis present

## 2023-02-21 DIAGNOSIS — J4521 Mild intermittent asthma with (acute) exacerbation: Secondary | ICD-10-CM

## 2023-02-21 NOTE — OB Triage Note (Signed)
Patient given discharge instructions and verbalized understanding. Patient discharged in stable condition via wheelchair accompanied by RN. Met husband (driver) at ER entrance.

## 2023-02-21 NOTE — OB Triage Note (Addendum)
L&D OB Triage Note  SUBJECTIVE Kathy Davenport is a 32 y.o. G2P1001 female at [redacted]w[redacted]d, EDD Estimated Date of Delivery: 04/24/23 who presented to triage with complaints of fall yesterday afternoon around three. She states her ankle gave out and she feel forward to her hand and knees. She denies hitting her abdomen. She has since been feeling fetal movement. She denies vaginal bleeding, loss of fluid, and contractions. She is feeling good movement. She denies abdominal pain but notes some pelvic pressure.  She also notes scratches to her hands knees, and knee pain.   OB History  Gravida Para Term Preterm AB Living  2 1 1  0 0 1  SAB IAB Ectopic Multiple Live Births  0 0 0 0 1    # Outcome Date GA Lbr Len/2nd Weight Sex Type Anes PTL Lv  2 Current           1 Term 02/14/21 [redacted]w[redacted]d 23:22 / 01:27 3325 g M Vag-Spont EPI  LIV     Name: Kathy Davenport     Apgar1: 8  Apgar5: 9    Medications Prior to Admission  Medication Sig Dispense Refill Last Dose   acetaminophen (TYLENOL) 325 MG tablet Take 2 tablets (650 mg total) by mouth every 4 (four) hours as needed (for pain scale < 4). 60 tablet 0 02/21/2023 at 1400   albuterol (PROVENTIL) (2.5 MG/3ML) 0.083% nebulizer solution Take 2.5 mg by nebulization every 6 (six) hours as needed for wheezing or shortness of breath.   Past Month   aspirin EC 81 MG tablet Take 1 tablet (81 mg total) by mouth daily. Take after 12 weeks for prevention of preeclampsia later in pregnancy 300 tablet 2 02/20/2023   doxylamine, Sleep, (UNISOM) 25 MG tablet Take 25 mg by mouth at bedtime as needed.   02/20/2023   escitalopram (LEXAPRO) 10 MG tablet TAKE 1 TABLET BY MOUTH EVERY DAY 90 tablet 3 02/21/2023   fexofenadine (ALLEGRA) 180 MG tablet Take 180 mg by mouth daily.   Past Week   fexofenadine-pseudoephedrine (ALLEGRA-D) 60-120 MG 12 hr tablet Take by mouth.   Past Week   fluticasone (FLONASE) 50 MCG/ACT nasal spray Place into both nostrils daily.   Past Month    fluticasone (FLOVENT HFA) 220 MCG/ACT inhaler Inhale 2 puffs into the lungs in the morning and at bedtime. 1 each 12 Past Month   hydrOXYzine (VISTARIL) 25 MG capsule Take 1 capsule (25 mg total) by mouth 3 (three) times daily as needed. 30 capsule 5 02/20/2023   Iron-FA-B Cmp-C-Biot-Probiotic (FUSION PLUS) CAPS Take 1 capsule by mouth daily at 6 (six) AM. 30 capsule 6 02/20/2023   montelukast (SINGULAIR) 10 MG tablet TAKE 1 TABLET BY MOUTH EVERYDAY AT BEDTIME 90 tablet 1 02/21/2023   Prenatal Vit-Fe Fumarate-FA (PRENATAL MULTIVITAMIN) TABS tablet Take 1 tablet by mouth daily at 12 noon.   02/20/2023   albuterol (VENTOLIN HFA) 108 (90 Base) MCG/ACT inhaler Inhale into the lungs.      Blood Pressure Monitoring (BLOOD PRESSURE KIT) DEVI 1 kit by Does not apply route as needed. Large 1 each 0    docusate sodium (COLACE) 100 MG capsule Take 1 capsule (100 mg total) by mouth 2 (two) times daily as needed. (Patient not taking: Reported on 02/21/2023) 30 capsule 0 Not Taking   Spacer/Aero-Holding Chambers (EASIVENT) inhaler Use with inhaler as instructed.        OBJECTIVE  Nursing Evaluation:   BP 122/67 (BP Location: Right Arm)  Pulse 77   Temp 98.4 F (36.9 C) (Oral)   Resp 18   Ht 5\' 4"  (1.626 m)   Wt 100.7 kg   LMP 07/18/2022   BMI 38.11 kg/m    Findings:   reassuring fetal status, no signs of abruption. Given pt is over 24 hours from fall, extended monitoring not indicated.       NST was performed and has been reviewed by me.  NST INTERPRETATION: Category I  Mode: External ([place] d/c'd per orders from CNM) Baseline Rate (A): 125 bpm Variability: Moderate Accelerations: 15 x 15 Decelerations: None     Contraction Frequency (min): occasional UI  ASSESSMENT Impression:  1.  Pregnancy:  G2P1001 at [redacted]w[redacted]d , EDD Estimated Date of Delivery: 04/24/23 2.  Reassuring fetal and maternal status 3.  Pt offered to go to ED for evaluation of her knee. Pt declines 4. Round ligament pain    PLAN 1. Current condition and above findings reviewed.  Reassuring fetal and maternal condition. Abruptions precautions reviewed. Encouraged belly band for pelvic pressure and round ligament pain.  2. Discharge home with standard labor precautions given to return to L&D or call the office for problems. 3. Continue routine prenatal care.  Doreene Burke, CNM

## 2023-02-21 NOTE — OB Triage Note (Signed)
Patient arrived in triage with c/o fall yesterday around 1500. Patient reports left ankle "gave out" and she landed on hands and knees while walk through a parking lot. Visible bruising and lacerations noted to hands and knees. No bruising or lacerations to abdomen. Patient denies bumping abdomen during fall. Abdomen palpates soft, nontender to touch. Monitors applied and assessing. Patient reports good fetal movement and fetal movement audible on monitor. Patient reports consistent braxton hicks for several weeks and still feeling those with no change since the fall. Increase in lower abdominal pressure noted today. Denies vaginal bleeding or leaking of fluid.

## 2023-02-21 NOTE — Discharge Instructions (Signed)
PRETERM LABOR: Includes any of the following symptoms that occur between 20-[redacted] weeks gestation. If these symptoms are not stopped, preterm labor can result in preterm delivery, placing your baby at risk. ° °Notify your doctor if any of the following occur: °1. Menstrual-like cramps   5. Pelvic pressure  °2. Uterine contractions. These may be painless and feel like the uterus is tightening or the baby is "balling up" 6. Increase or change in vaginal discharge  °3. Low, dull backache, unrelieved by heat or Tylenol  7. Vaginal bleeding  °4. Intestinal cramps, with our without diarrhea, sometimes 8. A general feeling that "something is not right"  ° 9. Leaking of fluid described as "gas pain"  ° °

## 2023-02-23 ENCOUNTER — Ambulatory Visit: Payer: 59 | Admitting: Obstetrics

## 2023-02-23 ENCOUNTER — Encounter: Payer: Self-pay | Admitting: Obstetrics

## 2023-02-23 VITALS — BP 113/74 | HR 85 | Wt 222.0 lb

## 2023-02-23 DIAGNOSIS — Z3480 Encounter for supervision of other normal pregnancy, unspecified trimester: Secondary | ICD-10-CM

## 2023-02-23 DIAGNOSIS — Z3483 Encounter for supervision of other normal pregnancy, third trimester: Secondary | ICD-10-CM

## 2023-02-23 MED ORDER — FUSION PLUS PO CAPS: 1.0000 | ORAL_CAPSULE | Freq: Every day | ORAL | 6 refills | Status: DC

## 2023-02-23 NOTE — Progress Notes (Signed)
    Return Prenatal Note   Assessment/Plan   Plan  32 y.o. G2P1001 at [redacted]w[redacted]d presents for follow-up OB visit. Reviewed prenatal record including previous visit note.  Supervision of other normal pregnancy, antepartum -Discussed comfort measures for BH ctx, normal discomforts of 3rd trimester -Reviewed s/s of PTL -Growth scan with MFM 03/15/23    No orders of the defined types were placed in this encounter.  Return in about 2 weeks (around 03/09/2023).   Future Appointments  Date Time Provider Department Center  03/09/2023 10:15 AM Doreene Burke, CNM [facility] None  03/15/2023  9:15 AM [facility] NURSE [facility]  03/15/2023  9:30 AM [facility] US5 [facility]  03/18/2023 10:15 AM Chryl Heck, Adan Sis, CNM [facility] None    For next visit:  continue with routine prenatal care     Subjective  Kathy Davenport is feeling well. She is having some BH but no regular contractions. The baby is active. She feels a little concerned that this baby will be much larger than her first.  Movement: Present Contractions: Irritability  Objective   Flow sheet Vitals: Pulse Rate: 85 BP: 113/74 Fundal Height: 32 cm Fetal Heart Rate (bpm): 134 Total weight gain: 15 lb (6.804 kg)  General Appearance  No acute distress, well appearing, and well nourished Pulmonary   Normal work of breathing Neurologic   Alert and oriented to person, place, and time Psychiatric   Mood and affect within normal limits  Guadlupe Spanish, CNM 02/23/23 9:39 AM

## 2023-02-23 NOTE — Assessment & Plan Note (Addendum)
-  Discussed comfort measures for BH ctx, normal discomforts of 3rd trimester -Reviewed s/s of PTL -Growth scan with MFM 03/15/23

## 2023-03-04 ENCOUNTER — Encounter: Payer: 59 | Admitting: Certified Nurse Midwife

## 2023-03-09 ENCOUNTER — Telehealth (INDEPENDENT_AMBULATORY_CARE_PROVIDER_SITE_OTHER): Payer: 59 | Admitting: Certified Nurse Midwife

## 2023-03-09 ENCOUNTER — Encounter: Payer: Self-pay | Admitting: Certified Nurse Midwife

## 2023-03-09 DIAGNOSIS — O26893 Other specified pregnancy related conditions, third trimester: Secondary | ICD-10-CM

## 2023-03-09 DIAGNOSIS — Z3A33 33 weeks gestation of pregnancy: Secondary | ICD-10-CM

## 2023-03-09 NOTE — Progress Notes (Signed)
Virtual Visit via Video Note  I connected with Meridee Score on 03/09/23 at 10:15 AM EDT by a video enabled telemedicine application and verified that I am speaking with the correct person using two identifiers.  Location: Patient: at home  Provider: at office   I discussed the limitations of evaluation and management by telemedicine and the availability of in person appointments. The patient expressed understanding and agreed to proceed.  History of Present Illness: G 2P1001 @ 33 weeks 3 days vitrual visit today due to her and her sone having a cold. She states her son started pre school and came come with a cold . He tested negative for Covid.    Observations/Objective: She denies fever, nausea and vomiting. She states the baby is moving well. She has had some braxton hicks. Revived PTL precautions and Pre eclampsia . She took her BP at home today 125/80.  Assessment and Plan:/ Follow up as scheduled for ROB and NST ( chronic hypertension).       I discussed the assessment and treatment plan with the patient. The patient was provided an opportunity to ask questions and all were answered. The patient agreed with the plan and demonstrated an understanding of the instructions.   The patient was advised to call back or seek an in-person evaluation if the symptoms worsen or if the condition fails to improve as anticipated.  I provided 12 minutes of non-face-to-face time during this encounter.   Doreene Burke, CNM

## 2023-03-10 ENCOUNTER — Telehealth: Payer: Self-pay

## 2023-03-10 NOTE — Telephone Encounter (Signed)
Back pain started this morning and a few contractions a hour, lower stomach gets tight, last night had heart pounding for a few hours . Per Kathy Davenport pt should drink plenty of water , pt states she only drinks 2 glasses a day. Pt aware to increase water intake take 2 tylenol now and 2 more in 4 hours. Try a hot bath and rest. If this does not help call us back and see if someone can send in flexeril for the back pian.

## 2023-03-15 ENCOUNTER — Encounter: Payer: Self-pay | Admitting: *Deleted

## 2023-03-15 ENCOUNTER — Ambulatory Visit: Payer: 59 | Admitting: *Deleted

## 2023-03-15 ENCOUNTER — Ambulatory Visit: Payer: 59 | Attending: Obstetrics and Gynecology

## 2023-03-15 VITALS — BP 124/74 | HR 92

## 2023-03-15 DIAGNOSIS — Z3480 Encounter for supervision of other normal pregnancy, unspecified trimester: Secondary | ICD-10-CM | POA: Insufficient documentation

## 2023-03-15 DIAGNOSIS — O09293 Supervision of pregnancy with other poor reproductive or obstetric history, third trimester: Secondary | ICD-10-CM | POA: Insufficient documentation

## 2023-03-15 DIAGNOSIS — E669 Obesity, unspecified: Secondary | ICD-10-CM | POA: Diagnosis not present

## 2023-03-15 DIAGNOSIS — O99213 Obesity complicating pregnancy, third trimester: Secondary | ICD-10-CM | POA: Diagnosis present

## 2023-03-15 DIAGNOSIS — J45909 Unspecified asthma, uncomplicated: Secondary | ICD-10-CM | POA: Insufficient documentation

## 2023-03-15 DIAGNOSIS — O99513 Diseases of the respiratory system complicating pregnancy, third trimester: Secondary | ICD-10-CM | POA: Insufficient documentation

## 2023-03-15 DIAGNOSIS — Z3A34 34 weeks gestation of pregnancy: Secondary | ICD-10-CM | POA: Insufficient documentation

## 2023-03-15 DIAGNOSIS — Z3689 Encounter for other specified antenatal screening: Secondary | ICD-10-CM | POA: Insufficient documentation

## 2023-03-18 ENCOUNTER — Ambulatory Visit (INDEPENDENT_AMBULATORY_CARE_PROVIDER_SITE_OTHER): Payer: 59 | Admitting: Obstetrics

## 2023-03-18 ENCOUNTER — Encounter: Payer: Self-pay | Admitting: Obstetrics

## 2023-03-18 ENCOUNTER — Ambulatory Visit (INDEPENDENT_AMBULATORY_CARE_PROVIDER_SITE_OTHER): Payer: 59

## 2023-03-18 ENCOUNTER — Encounter: Payer: 59 | Admitting: Obstetrics

## 2023-03-18 ENCOUNTER — Other Ambulatory Visit: Payer: 59

## 2023-03-18 VITALS — BP 113/65 | HR 90 | Wt 226.1 lb

## 2023-03-18 VITALS — BP 113/65 | HR 90 | Ht 64.0 in | Wt 226.1 lb

## 2023-03-18 DIAGNOSIS — E669 Obesity, unspecified: Secondary | ICD-10-CM | POA: Diagnosis not present

## 2023-03-18 DIAGNOSIS — O99343 Other mental disorders complicating pregnancy, third trimester: Secondary | ICD-10-CM

## 2023-03-18 DIAGNOSIS — Z3A34 34 weeks gestation of pregnancy: Secondary | ICD-10-CM

## 2023-03-18 DIAGNOSIS — O99213 Obesity complicating pregnancy, third trimester: Secondary | ICD-10-CM | POA: Diagnosis not present

## 2023-03-18 DIAGNOSIS — Z3480 Encounter for supervision of other normal pregnancy, unspecified trimester: Secondary | ICD-10-CM

## 2023-03-18 DIAGNOSIS — F419 Anxiety disorder, unspecified: Secondary | ICD-10-CM

## 2023-03-18 MED ORDER — ESCITALOPRAM OXALATE 10 MG PO TABS: 10.0000 mg | ORAL_TABLET | Freq: Every day | ORAL | 12 refills | Status: AC

## 2023-03-18 NOTE — Progress Notes (Signed)
    Return Prenatal Note   Assessment/Plan   Plan  32 y.o. G2P1001 at [redacted]w[redacted]d presents for follow-up OB visit. Reviewed prenatal record including previous visit note.  Supervision of other normal pregnancy, antepartum -MFM [place]: Growth 83rd%ile, AFI 24.4 -Reviewed fetal kick counts, when to go to the hospital -Discussed GBS and GC/chlamydia swabs at next visit   Obesity affecting pregnancy in third trimester -RNST today. Extended monitoring d/t some difficulty tracing baby and initially non-reactive. Moderate variability with accels towards end of tracing. Reviewed with Dr. Valentino Saxon. Will continue weekly NSTs.  Anxiety disorder affecting pregnancy, antepartum -Taking Lexapro and hydroxyzine which are helping a lot. New rx sent pharmacy on file because she states she received a 30-day supply although it was prescribed for 90 days and [facility] will not refill.   Orders Placed This Encounter  Procedures   POCT Urinalysis Dipstick   No follow-ups on file.   Future Appointments  Date Time Provider Department Center  03/26/2023  8:15 AM [facility] ROOM [facility] None  03/26/2023 10:15 AM Hartley Barefoot L, CNM [facility] None  04/01/2023 10:00 AM AOB-NST ROOM [facility] None  04/01/2023 10:35 AM Free, Lindalou Hose, CNM [facility] None  04/08/2023 10:00 AM AOB-NST ROOM [facility] None  04/08/2023 10:15 AM Mirna Mires, CNM [facility] None  04/15/2023 10:15 AM Dominica Severin, CNM [facility] None  04/22/2023 10:00 AM AOB-NST ROOM [facility] None  04/22/2023 10:15 AM Dominic, Courtney Heys, CNM [facility] None    For next visit:  ROB with GBS screening      Subjective   Miya is having some contractions but nothing regular yet. She is working on getting childcare for labor arranged. Anxiety and sleep are improving with Lexapro and hydroxyzine.  Movement: Present Contractions: Irritability  Objective   Flow sheet Vitals: Pulse Rate: 90 BP: 113/65 Fundal Height: 35 cm Fetal Heart Rate (bpm): See NST Total  weight gain: 19 lb 1.6 oz (8.664 kg)  General Appearance  No acute distress, well appearing, and well nourished Pulmonary   Normal work of breathing Neurologic   Alert and oriented to person, place, and time Psychiatric   Mood and affect within normal limits  Guadlupe Spanish, CNM 03/18/23 4:56 PM

## 2023-03-18 NOTE — Progress Notes (Signed)
NURSE VISIT NOTE  Subjective:    Patient ID: Kathy Davenport, female    DOB: [birth year], 32 y.o.   MRN: [id number]  HPI  Patient is a 32 y.o. G22P1001 female who presents for fetal monitoring per order from Doreene Burke, CNM.   Objective:    BP 113/65   Pulse 90   Ht 5\' 4"  (1.626 m)   Wt 226 lb 1.6 oz (102.6 kg)   LMP 07/18/2022   BMI 38.81 kg/m  Estimated Date of Delivery: 04/24/23  [redacted]w[redacted]d  Fetus A Non-Stress Test Interpretation for 03/18/23  Indication: Obesity  Fetal Heart Rate A Mode: External Baseline Rate (A): 135 bpm Variability: Moderate Accelerations: 15 x 15 Decelerations: None Multiple birth?: No  Uterine Activity Mode: Toco Contraction Frequency (min): NONE  Interpretation (Fetal Testing) Nonstress Test Interpretation: Reactive Overall Impression: Reassuring for gestational age   Assessment:   1. Obesity affecting pregnancy in third trimester, unspecified obesity type   2. [redacted] weeks gestation of pregnancy      Plan:   Results reviewed and discussed with patient by  Guadlupe Spanish, CNM.     Rocco Serene, LPN

## 2023-03-18 NOTE — Assessment & Plan Note (Signed)
-  Taking Lexapro and hydroxyzine which are helping a lot. New rx sent pharmacy on file because she states she received a 30-day supply although it was prescribed for 90 days and [facility] will not refill.

## 2023-03-18 NOTE — Patient Instructions (Signed)

## 2023-03-18 NOTE — Assessment & Plan Note (Signed)
-  RNST today. Extended monitoring d/t some difficulty tracing baby and initially non-reactive. Moderate variability with accels towards end of tracing. Reviewed with Dr. Valentino Saxon. Will continue weekly NSTs.

## 2023-03-18 NOTE — Assessment & Plan Note (Addendum)
-  MFM [place]: Growth 83rd%ile, AFI 24.4 -Reviewed fetal kick counts, when to go to the hospital -Discussed GBS and GC/chlamydia swabs at next visit

## 2023-03-22 ENCOUNTER — Encounter: Payer: Self-pay | Admitting: Obstetrics

## 2023-03-25 ENCOUNTER — Other Ambulatory Visit: Payer: 59

## 2023-03-26 ENCOUNTER — Ambulatory Visit (INDEPENDENT_AMBULATORY_CARE_PROVIDER_SITE_OTHER): Payer: 59 | Admitting: Certified Nurse Midwife

## 2023-03-26 ENCOUNTER — Other Ambulatory Visit (HOSPITAL_COMMUNITY)
Admission: RE | Admit: 2023-03-26 | Discharge: 2023-03-26 | Disposition: A | Discharge status: Home / Self Care | Payer: 59 | Source: Ambulatory Visit | Attending: Certified Nurse Midwife | Admitting: Certified Nurse Midwife

## 2023-03-26 ENCOUNTER — Encounter: Payer: Self-pay | Admitting: Certified Nurse Midwife

## 2023-03-26 ENCOUNTER — Ambulatory Visit (INDEPENDENT_AMBULATORY_CARE_PROVIDER_SITE_OTHER): Payer: 59

## 2023-03-26 VITALS — BP 96/62 | HR 108 | Ht 64.0 in | Wt 228.4 lb

## 2023-03-26 VITALS — BP 96/62 | HR 108 | Wt 228.4 lb

## 2023-03-26 DIAGNOSIS — Z3685 Encounter for antenatal screening for Streptococcus B: Secondary | ICD-10-CM

## 2023-03-26 DIAGNOSIS — E669 Obesity, unspecified: Secondary | ICD-10-CM | POA: Diagnosis not present

## 2023-03-26 DIAGNOSIS — Z3A35 35 weeks gestation of pregnancy: Secondary | ICD-10-CM

## 2023-03-26 DIAGNOSIS — F419 Anxiety disorder, unspecified: Secondary | ICD-10-CM

## 2023-03-26 DIAGNOSIS — Z113 Encounter for screening for infections with a predominantly sexual mode of transmission: Secondary | ICD-10-CM | POA: Diagnosis present

## 2023-03-26 DIAGNOSIS — O99213 Obesity complicating pregnancy, third trimester: Secondary | ICD-10-CM | POA: Diagnosis not present

## 2023-03-26 DIAGNOSIS — O99343 Other mental disorders complicating pregnancy, third trimester: Secondary | ICD-10-CM

## 2023-03-26 DIAGNOSIS — Z23 Encounter for immunization: Secondary | ICD-10-CM

## 2023-03-26 DIAGNOSIS — Z3480 Encounter for supervision of other normal pregnancy, unspecified trimester: Secondary | ICD-10-CM

## 2023-03-26 DIAGNOSIS — E668 Other obesity: Secondary | ICD-10-CM

## 2023-03-26 NOTE — Progress Notes (Signed)
    NURSE VISIT NOTE  Subjective:    Patient ID: Kathy Davenport, female    DOB: 1990-11-14, 32 y.o.   MRN: [id number]  HPI  Patient is a 32 y.o. G51P1001 female who presents for fetal monitoring per order from Guadlupe Spanish, CNM.   Objective:    BP 96/62   Pulse (!) 108   Ht 5\' 4"  (1.626 m)   Wt 228 lb 6.4 oz (103.6 kg)   LMP 07/18/2022   BMI 39.20 kg/m  Estimated Date of Delivery: 04/24/23  [redacted]w[redacted]d  Fetus A Non-Stress Test Interpretation for 03/26/23  Indication: Obesity  Fetal Heart Rate A Mode: External Baseline Rate (A): 130 bpm Variability: Moderate Accelerations: 15 x 15 Decelerations: None Multiple birth?: No  Uterine Activity Mode: Toco Contraction Frequency (min): 10 Contraction Duration (sec): 40-60 Contraction Quality: Mild Resting Time: Adequate  Interpretation (Fetal Testing) Nonstress Test Interpretation: Reactive   Assessment:   1. Obesity affecting pregnancy in third trimester, unspecified obesity type   2. [redacted] weeks gestation of pregnancy      Plan:   Results reviewed and discussed with patient by  Hartley Barefoot, CNM.     Rocco Serene, LPN

## 2023-03-26 NOTE — Patient Instructions (Signed)
Third Trimester of Pregnancy  The third trimester of pregnancy is from week 28 through week 40. This is months 7 through 9. The third trimester is a time when the unborn baby (fetus) is growing rapidly. At the end of the ninth month, the fetus is about 20 inches long and weighs 6-10 pounds. Body changes during your third trimester During the third trimester, your body will continue to go through many changes. The changes vary and generally return to normal after your baby is born. Physical changes Your weight will continue to increase. You can expect to gain 25-35 pounds (11-16 kg) by the end of the pregnancy if you begin pregnancy at a normal weight. If you are underweight, you can expect to gain 28-40 lb (about 13-18 kg), and if you are overweight, you can expect to gain 15-25 lb (about 7-11 kg). You may begin to get stretch marks on your hips, abdomen, and breasts. Your breasts will continue to grow and may hurt. A yellow fluid (colostrum) may leak from your breasts. This is the first milk you are producing for your baby. You may have changes in your hair. These can include thickening of your hair, rapid growth, and changes in texture. Some people also have hair loss during or after pregnancy, or hair that feels dry or thin. Your belly button may stick out. You may notice more swelling in your hands, face, or ankles. Health changes You may have heartburn. You may have constipation. You may develop hemorrhoids. You may develop swollen, bulging veins (varicose veins) in your legs. You may have increased body aches in the pelvis, back, or thighs. This is due to weight gain and increased hormones that are relaxing your joints. You may have increased tingling or numbness in your hands, arms, and legs. The skin on your abdomen may also feel numb. You may feel short of breath because of your expanding uterus. Other changes You may urinate more often because the fetus is moving lower into your pelvis  and pressing on your bladder. You may have more problems sleeping. This may be caused by the size of your abdomen, an increased need to urinate, and an increase in your body's metabolism. You may notice the fetus "dropping," or moving lower in your abdomen (lightening). You may have increased vaginal discharge. You may notice that you have pain around your pelvic bone as your uterus distends. Follow these instructions at home: Medicines Follow your health care provider's instructions regarding medicine use. Specific medicines may be either safe or unsafe to take during pregnancy. Do not take any medicines unless approved by your health care provider. Take a prenatal vitamin that contains at least 600 micrograms (mcg) of folic acid. Eating and drinking Eat a healthy diet that includes fresh fruits and vegetables, whole grains, good sources of protein such as meat, eggs, or tofu, and low-fat dairy products. Avoid raw meat and unpasteurized juice, milk, and cheese. These carry germs that can harm you and your baby. Eat 4 or 5 small meals rather than 3 large meals a day. You may need to take these actions to prevent or treat constipation: Drink enough fluid to keep your urine pale yellow. Eat foods that are high in fiber, such as beans, whole grains, and fresh fruits and vegetables. Limit foods that are high in fat and processed sugars, such as fried or sweet foods. Activity Exercise only as directed by your health care provider. Most people can continue their usual exercise routine during pregnancy. Try   to exercise for 30 minutes at least 5 days a week. Stop exercising if you experience contractions in the uterus. Stop exercising if you develop pain or cramping in the lower abdomen or lower back. Avoid heavy lifting. Do not exercise if it is very hot or humid or if you are at a high altitude. If you choose to, you may continue to have sex unless your health care provider tells you not  to. Relieving pain and discomfort Take frequent breaks and rest with your legs raised (elevated) if you have leg cramps or low back pain. Take warm sitz baths to soothe any pain or discomfort caused by hemorrhoids. Use hemorrhoid cream if your health care provider approves. Wear a supportive bra to prevent discomfort from breast tenderness. If you develop varicose veins: Wear support hose as told by your health care provider. Elevate your feet for 15 minutes, 3-4 times a day. Limit salt in your diet. Safety Talk to your health care provider before traveling far distances. Do not use hot tubs, steam rooms, or saunas. Wear your seat belt at all times when driving or riding in a car. Talk with your health care provider if someone is verbally or physically abusive to you. Preparing for birth To prepare for the arrival of your baby: Take prenatal classes to understand, practice, and ask questions about labor and delivery. Visit the hospital and tour the maternity area. Purchase a rear-facing car seat and make sure you know how to install it in your car. Prepare the baby's room or sleeping area. Make sure to remove all pillows and stuffed animals from the baby's crib to prevent suffocation. General instructions Avoid cat litter boxes and soil used by cats. These carry germs that can cause birth defects in the baby. If you have a cat, ask someone to clean the litter box for you. Do not douche or use tampons. Do not use scented sanitary pads. Do not use any products that contain nicotine or tobacco, such as cigarettes, e-cigarettes, and chewing tobacco. If you need help quitting, ask your health care provider. Do not use any herbal remedies, illegal drugs, or medicines that were not prescribed to you. Chemicals in these products can harm your baby. Do not drink alcohol. You will have more frequent prenatal exams during the third trimester. During a routine prenatal visit, your health care provider  will do a physical exam, perform tests, and discuss your overall health. Keep all follow-up visits. This is important. Where to find more information American Pregnancy Association: americanpregnancy.org American College of Obstetricians and Gynecologists: acog.org/en/Womens%20Health/Pregnancy Office on Women's Health: womenshealth.gov/pregnancy Contact a health care provider if you have: A fever. Mild pelvic cramps, pelvic pressure, or nagging pain in your abdominal area or lower back. Vomiting or diarrhea. Bad-smelling vaginal discharge or foul-smelling urine. Pain when you urinate. A headache that does not go away when you take medicine. Visual changes or see spots in front of your eyes. Get help right away if: Your water breaks. You have regular contractions less than 5 minutes apart. You have spotting or bleeding from your vagina. You have severe abdominal pain. You have difficulty breathing. You have chest pain. You have fainting spells. You have not felt your baby move for the time period told by your health care provider. You have new or increased pain, swelling, or redness in an arm or leg. Summary The third trimester of pregnancy is from week 28 through week 40 (months 7 through 9). You may have more problems   sleeping. This can be caused by the size of your abdomen, an increased need to urinate, and an increase in your body's metabolism. You will have more frequent prenatal exams during the third trimester. Keep all follow-up visits. This is important. This information is not intended to replace advice given to you by your health care provider. Make sure you discuss any questions you have with your health care provider. Document Revised: 11/22/2019 Document Reviewed: 09/28/2019 Elsevier Patient Education  2024 [employer] Inc.  Group B Streptococcus Infection During Pregnancy Group B Streptococcus (GBS) is a type of bacteria that is often found in healthy people. It is  commonly found in the rectum, vagina, and intestines. In people who are healthy and not pregnant, the bacteria rarely cause serious illness or complications. However, women who test positive for GBS during pregnancy can pass the bacteria to the baby during childbirth. This can cause serious infection in the baby after birth. Women with GBS may also have infections during their pregnancy or soon after childbirth. The infections include urinary tract infections (UTIs) or infections of the uterus. GBS also increases a woman's risk of complications during pregnancy, such as early labor or delivery, miscarriage, or stillbirth. Routine testing for GBS is recommended for all pregnant women. What are the causes? This condition is caused by bacteria called Streptococcus agalactiae. What increases the risk? You may have a higher risk for GBS infection during pregnancy if you had one during a past pregnancy. What are the signs or symptoms? In most cases, GBS infection does not cause symptoms in pregnant women. If symptoms exist, they may include: Labor that starts before the 37th week of pregnancy. A UTI or bladder infection. This may cause a fever, frequent urination, or pain and burning during urination. Fever during labor. There can also be a rapid heartbeat in the mother or baby. Rare but serious symptoms of a GBS infection in women include: Blood infection (septicemia). This may cause fever, chills, or confusion. Lung infection (pneumonia). This may cause fever, chills, cough, rapid breathing, chest pain, or difficulty breathing. Bone, joint, skin, or soft tissue infection. How is this diagnosed? You may be screened for GBS between week 35 and week 37 of pregnancy. If you have symptoms of preterm labor, you may be screened earlier. This condition is diagnosed based on lab test results from: A swab of fluid from the vagina and rectum. A urine sample. How is this treated? This condition is treated with  antibiotic medicine. Antibiotic medicine may be given: To you when you go into labor, or as soon as your water breaks. The medicines will continue until after you give birth. If you are having a cesarean delivery, you do not need antibiotics unless your water has broken. To your baby, if he or she requires treatment. Your health care provider will check your baby to decide if he or she needs antibiotics to prevent a serious infection. Follow these instructions at home: Take over-the-counter and prescription medicines only as told by your health care provider. Take your antibiotic medicine as told by your health care provider. Do not stop taking the antibiotic even if you start to feel better. Keep all pre-birth (prenatal) visits and follow-up visits as told by your health care provider. This is important. Contact a health care provider if: You have pain or burning when you urinate. You have to urinate more often than usual. You have a fever or chills. You develop a bad-smelling vaginal discharge. Get help right away if:   Your water breaks. You go into labor. You have severe pain in your abdomen. You have difficulty breathing. You have chest pain. These symptoms may represent a serious problem that is an emergency. Do not wait to see if the symptoms will go away. Get medical help right away. Call your local emergency services (911 in the [place] Do not drive yourself to the hospital. Summary GBS is a type of bacteria that is common in healthy people. During pregnancy, colonization with GBS can cause serious complications for you or your baby. Your health care provider will screen you between 35 and 37 weeks of pregnancy to determine if you are colonized with GBS. If you are colonized with GBS during pregnancy, your health care provider will recommend antibiotics through an IV during labor. After delivery, your baby will be evaluated for complications related to potential GBS infection and may  require antibiotics to prevent a serious infection. This information is not intended to replace advice given to you by your health care provider. Make sure you discuss any questions you have with your health care provider. Document Revised: 06/01/2022 Document Reviewed: 06/01/2022 Elsevier Patient Education  2024 Elsevier Inc.  

## 2023-03-26 NOTE — Progress Notes (Signed)
    Return Prenatal Note   Subjective   32 y.o. G2P1001 at [redacted]w[redacted]d presents for this follow-up prenatal visit.  Patient feeling well, active baby. 34 week labs collected. Patient reports: Movement: Present Contractions: Irritability  Objective   Flow sheet Vitals: Pulse Rate: (!) 108 BP: 96/62 Fundal Height: 36 cm Fetal Heart Rate (bpm):  (RNST) Total weight gain: 21 lb 6.4 oz (9.707 kg)  General Appearance  No acute distress, well appearing, and well nourished Pulmonary   Normal work of breathing Neurologic   Alert and oriented to person, place, and time Psychiatric   Mood and affect within normal limits  Assessment/Plan   Plan  32 y.o. G2P1001 at [redacted]w[redacted]d presents for follow-up OB visit. Reviewed prenatal record including previous visit note. 1. Supervision of other normal pregnancy, antepartum  2. Other obesity affecting pregnancy in third trimester  3. Anxiety disorder affecting pregnancy, antepartum  4. [redacted] weeks gestation of pregnancy  5. Antenatal screening for streptococcus B - Strep Gp B Culture+Rflx  6. Screening examination for venereal disease - Cervicovaginal ancillary only  7. Encounter for immunization - Flu vaccine trivalent PF, 6mos and older(Flulaval,Afluria,Fluarix,Fluzone)  RNST today. Reviewed timing of delivery given BMI now 39 with expectation of continued weight gain. First pregnancy was induced for GHTN.  Orders Placed This Encounter  Procedures   Strep Gp B Culture+Rflx   Flu vaccine trivalent PF, 6mos and older(Flulaval,Afluria,Fluarix,Fluzone)   Return in 1 week (on 04/02/2023) for ROB & NST.   Future Appointments  Date Time Provider Department Center  04/01/2023 10:00 AM AOB-NST ROOM [facility] None  04/01/2023 10:35 AM Free, Lindalou Hose, CNM [facility] None  04/08/2023 10:00 AM AOB-NST ROOM [facility] None  04/08/2023 10:15 AM Mirna Mires, CNM [facility] None  04/15/2023 10:15 AM Dominica Severin, CNM [facility] None  04/22/2023 10:00 AM  AOB-NST ROOM [facility] None  04/22/2023 10:15 AM Dominic, Courtney Heys, CNM [facility] None    For next visit:  continue with routine prenatal care     Dominica Severin, CNM  03/25/2409:56 AM

## 2023-03-26 NOTE — Patient Instructions (Signed)

## 2023-03-29 LAB — CERVICOVAGINAL ANCILLARY ONLY
Chlamydia: NEGATIVE
Comment: NEGATIVE
Comment: NORMAL
Neisseria Gonorrhea: NEGATIVE

## 2023-03-30 LAB — STREP GP B CULTURE+RFLX: Strep Gp B Culture+Rflx: NEGATIVE

## 2023-04-01 ENCOUNTER — Ambulatory Visit: Payer: 59

## 2023-04-01 VITALS — BP 102/69 | Ht 64.0 in | Wt 229.6 lb

## 2023-04-01 VITALS — BP 102/69 | Wt 229.6 lb

## 2023-04-01 DIAGNOSIS — O99213 Obesity complicating pregnancy, third trimester: Secondary | ICD-10-CM | POA: Diagnosis not present

## 2023-04-01 DIAGNOSIS — O99343 Other mental disorders complicating pregnancy, third trimester: Secondary | ICD-10-CM

## 2023-04-01 DIAGNOSIS — F419 Anxiety disorder, unspecified: Secondary | ICD-10-CM

## 2023-04-01 DIAGNOSIS — Z3A36 36 weeks gestation of pregnancy: Secondary | ICD-10-CM

## 2023-04-01 DIAGNOSIS — E669 Obesity, unspecified: Secondary | ICD-10-CM

## 2023-04-01 DIAGNOSIS — Z3480 Encounter for supervision of other normal pregnancy, unspecified trimester: Secondary | ICD-10-CM

## 2023-04-01 DIAGNOSIS — Z8759 Personal history of other complications of pregnancy, childbirth and the puerperium: Secondary | ICD-10-CM

## 2023-04-01 NOTE — Progress Notes (Signed)
    Return Prenatal Note   Assessment/Plan   Plan  32 y.o. G2P1001 at [redacted]w[redacted]d presents for follow-up OB visit. Reviewed prenatal record including previous visit note.  Supervision of other normal pregnancy, antepartum - Starting to do things to encourage timely labor. Has been drinking red raspberry leaf tea and plans to start doing [employer] regularly. - Reviewed labor warning signs and expectations for birth. Instructed to call office or come to hospital with persistent headache, vision changes, regular contractions, leaking of fluid, decreased fetal movement or vaginal bleeding.  Obesity affecting pregnancy in third trimester - RNST in clinic today. - Growth [place] due in 2 weeks, ordered today.  History of gestational hypertension - Continues to be normotensive. Checking Bps at home.   Anxiety disorder affecting pregnancy, antepartum - Continues to have some episodes of racing heart, particularly at night. Taking Unisom and hydroxyzine nightly. Recommended nightly epsom salt baths for relaxation as well.    Orders Placed This Encounter  Procedures   US OB Follow Up    Standing Status:   Future    Standing Expiration Date:   07/02/2023    Order Specific Question:   Reason for exam:    Answer:   growth, elevated BMI    Order Specific Question:   Preferred imaging location?    Answer:   [facility]   US Fetal BPP W/O Non Stress    Standing Status:   Future    Standing Expiration Date:   03/31/2024    Order Specific Question:   Reason for Exam (SYMPTOM  OR DIAGNOSIS REQUIRED)    Answer:   Obesity    Order Specific Question:   Preferred Imaging Location?    Answer:   [facility]    Order Specific Question:   Release to patient    Answer:   Immediate   Return in about 1 week (around 04/08/2023) for Already scheduled appointment.   Future Appointments  Date Time Provider Department Center  04/08/2023 10:00 AM AOB-NST ROOM [facility] None  04/08/2023 10:15 AM  Mirna Mires, CNM [facility] None  04/15/2023 10:15 AM Dominica Severin, CNM [facility] None  04/22/2023 10:00 AM AOB-NST ROOM [facility] None  04/22/2023 10:15 AM Dominic, Courtney Heys, CNM [facility] None    For next visit:  continue with routine prenatal care     Subjective   31 y.o. G2P1001 at [redacted]w[redacted]d presents for this follow-up prenatal visit.  Patient has been experiencing more episodes of racing heart.  Patient reports: Movement: Present Contractions: Irritability  Objective   Flow sheet Vitals: BP: 102/69 Fundal Height: 37 cm Fetal Heart Rate (bpm): RNST Presentation: Vertex Total weight gain: 22 lb 9.6 oz (10.3 kg)  General Appearance  No acute distress, well appearing, and well nourished Pulmonary   Normal work of breathing Neurologic   Alert and oriented to person, place, and time Psychiatric   Mood and affect within normal limits  Lindalou Hose Jennavieve Arrick, CNM  03/31/2409:56 AM

## 2023-04-01 NOTE — Assessment & Plan Note (Signed)
-   Continues to have some episodes of racing heart, particularly at night. Taking Unisom and hydroxyzine nightly. Recommended nightly epsom salt baths for relaxation as well.

## 2023-04-01 NOTE — Assessment & Plan Note (Addendum)
-   RNST in clinic today. - Growth [place] due in 2 weeks, ordered today.

## 2023-04-01 NOTE — Patient Instructions (Signed)

## 2023-04-01 NOTE — Progress Notes (Signed)
    NURSE VISIT NOTE  Subjective:    Patient ID: Kathy Davenport, female    DOB: [birth year], 32 y.o.   MRN: [id number]  HPI  Patient is a 32 y.o. G80P1001 female who presents for fetal monitoring per order from Hartley Barefoot, CNM.   Objective:    BP 102/69   Ht 5\' 4"  (1.626 m)   Wt 229 lb 9.6 oz (104.1 kg)   LMP 07/18/2022   BMI 39.41 kg/m  Estimated Date of Delivery: 04/24/23  [redacted]w[redacted]d  Fetus A Non-Stress Test Interpretation for 04/01/23  Indication: Obesity  Fetal Heart Rate A Mode: External Baseline Rate (A): 135 bpm Variability: Moderate Accelerations: 15 x 15 Decelerations: None Multiple birth?: No  Uterine Activity Mode: Toco Contraction Frequency (min): x1 Contraction Duration (sec): 40 Contraction Quality: Mild  Interpretation (Fetal Testing) Nonstress Test Interpretation: Reactive Overall Impression: Reassuring for gestational age   Assessment:   1. Obesity affecting pregnancy in third trimester, unspecified obesity type   2. [redacted] weeks gestation of pregnancy      Plan:   Results reviewed and discussed with patient by  Autumn Messing, CNM.     Rocco Serene, LPN

## 2023-04-01 NOTE — Assessment & Plan Note (Signed)
-   Continues to be normotensive. Checking Bps at home.

## 2023-04-01 NOTE — Assessment & Plan Note (Addendum)
-   Starting to do things to encourage timely labor. Has been drinking red raspberry leaf tea and plans to start doing [employer] regularly. - Reviewed labor warning signs and expectations for birth. Instructed to call office or come to hospital with persistent headache, vision changes, regular contractions, leaking of fluid, decreased fetal movement or vaginal bleeding.

## 2023-04-06 ENCOUNTER — Telehealth: Payer: Self-pay

## 2023-04-06 NOTE — Telephone Encounter (Signed)
Pt calling; is [redacted]w[redacted]d; hasn't felt the baby as much as usual today.  What to do?  [phone]  Pt states she has had movement just now as much as usual.  Pt ate 30 min ago and had some caffeine earlier today; adv to get a little more caffeine, lie on her side; a normal kick count is four movements in one hour. Will touch base with her in an hour. Pt agreed.

## 2023-04-06 NOTE — Telephone Encounter (Signed)
Called pt to see about how many FM she's had in the last hour.  She states she has definitely had more than four movements in the last hour; she drank a cup of coffee.  Pt is reassured.

## 2023-04-08 ENCOUNTER — Ambulatory Visit: Payer: 59

## 2023-04-08 ENCOUNTER — Ambulatory Visit: Payer: 59 | Admitting: Obstetrics

## 2023-04-08 ENCOUNTER — Encounter: Payer: Self-pay | Admitting: Obstetrics

## 2023-04-08 VITALS — BP 101/64 | HR 99 | Ht 64.0 in | Wt 228.5 lb

## 2023-04-08 VITALS — BP 101/64 | HR 99 | Wt 228.5 lb

## 2023-04-08 DIAGNOSIS — O99213 Obesity complicating pregnancy, third trimester: Secondary | ICD-10-CM

## 2023-04-08 DIAGNOSIS — E669 Obesity, unspecified: Secondary | ICD-10-CM

## 2023-04-08 DIAGNOSIS — Z3480 Encounter for supervision of other normal pregnancy, unspecified trimester: Secondary | ICD-10-CM

## 2023-04-08 DIAGNOSIS — O99013 Anemia complicating pregnancy, third trimester: Secondary | ICD-10-CM

## 2023-04-08 DIAGNOSIS — Z3A37 37 weeks gestation of pregnancy: Secondary | ICD-10-CM

## 2023-04-08 DIAGNOSIS — D649 Anemia, unspecified: Secondary | ICD-10-CM

## 2023-04-08 LAB — POCT URINALYSIS DIPSTICK OB
Bilirubin, UA: NEGATIVE
Blood, UA: NEGATIVE
Glucose, UA: NEGATIVE
Ketones, UA: NEGATIVE
Leukocytes, UA: NEGATIVE
Nitrite, UA: NEGATIVE
POC,PROTEIN,UA: NEGATIVE
Spec Grav, UA: 1.025 (ref 1.010–1.025)
Urobilinogen, UA: 0.2 U/dL
pH, UA: 6.5 (ref 5.0–8.0)

## 2023-04-08 NOTE — Progress Notes (Signed)
Routine Prenatal Care Visit  Subjective  Kathy Davenport is a 32 y.o. G2P1001 at [redacted]w[redacted]d being seen today for ongoing prenatal care.  She is currently monitored for the following issues for this high-risk pregnancy and has BMI 30.0-30.9,adult; Attention deficit hyperactivity disorder (ADHD), predominantly inattentive type; Anxiety; History of gestational hypertension; Supervision of other normal pregnancy, antepartum; Asthma; Obesity affecting pregnancy in third trimester; Anxiety disorder affecting pregnancy, antepartum; Anemia; and Mild intermittent asthma without complication on their problem list.  ----------------------------------------------------------------------------------- Patient reports no complaints.   Contractions: Irregular. Vag. Bleeding: None.  Movement: Present. Leaking Fluid denies.  ----------------------------------------------------------------------------------- The following portions of the patient's history were reviewed and updated as appropriate: allergies, current medications, past family history, past medical history, past social history, past surgical history and problem list. Problem list updated.  Objective  Blood pressure 101/64, pulse 99, weight 228 lb 8 oz (103.6 kg), last menstrual period 07/18/2022, currently breastfeeding. Pregravid weight 207 lb (93.9 kg) Total Weight Gain 21 lb 8 oz (9.752 kg) Urinalysis: Urine Protein    Urine Glucose    Fetal Status: Fetal Heart Rate (bpm): RNST   Movement: Present  Presentation: Vertex  General:  Alert, oriented and cooperative. Patient is in no acute distress.  Skin: Skin is warm and dry. No rash noted.   Cardiovascular: Normal heart rate noted  Respiratory: Normal respiratory effort, no problems with respiration noted  Abdomen: Soft, gravid, appropriate for gestational age. Pain/Pressure: Present     Pelvic:   1.5 cms/thick for soft/-3. Roomy pelvis         Extremities: Normal range of motion.     Mental Status:  Normal mood and affect. Normal behavior. Normal judgment and thought content.   Assessment   32 y.o. G2P1001 at [redacted]w[redacted]d by  04/24/2023, by Last Menstrual Period presenting for routine prenatal visit Reactive NST for high BMI Favorable for IOL at 39 weeks.  Plan   08/27/22 Problems (from 08/27/22 to present)     Problem Noted Resolved   Anemia 02/08/2023 by Burney Gauze, CNM No   Overview Signed 02/08/2023 10:15 AM by Burney Gauze, CNM    28 wks: Hgb 10.6, iron supplement ordered      Asthma 11/10/2022 by Carlynn Herald, CNM No   Supervision of other normal pregnancy, antepartum 09/22/2022 by Harrel Lemon, RN No   Overview Addendum 04/08/2023 10:45 AM by Mirna Mires, CNM     Nursing Staff Provider  Office Location [facility] Dating  04/24/2023, by Last Menstrual Period  [facility] Model Traditional    Language  English Anatomy US  Normal  Flu Vaccine  03/26/23 Genetic/Carrier Screen  NIPS: NEG/FEMALE AFP:    [place]:   TDaP Vaccine   08/08 Hgb A1C or  GTT Early -  Third trimester -   COVID Vaccine  Yes   LAB RESULTS   Rhogam  A/Positive/-- (05/14 1521)  Blood Type A/Positive/-- (05/14 1521)   Baby Feeding Plan Considering breast Antibody Negative (05/14 1521)  Contraception Undecided Rubella 2.76 (05/14 1521)  Circumcision  Yes if female RPR Non Reactive (05/14 1521)   Pediatrician   [facility] HBsAg Negative (05/14 1521)   Support Person  [facility]ar[facility] HCVAb Non Reactive (05/14 1521)   Prenatal Classes  HIV Non Reactive (05/14 1521)     BTL Consent  GBS  negative (For PCN allergy, check sensitivities)   VBAC Consent  Pap Diagnosis  Date Value Ref Range Status  11/10/2022   Final   -  Negative for intraepithelial lesion or malignancy (NILM)         DME Rx Arly.Keller ] BP cuff [ ]  Weight Scale Waterbirth  [ ]  Class [ ]  Consent [ ]  CNM visit  PHQ9 & GAD7 [  X] new OB [  ] 28 weeks  [  ] 36 weeks Induction  [ ]  Orders Entered [ ] Foley Y/N              Term labor symptoms  and general obstetric precautions including but not limited to vaginal bleeding, contractions, leaking of fluid and fetal movement were reviewed in detail with the patient. Please refer to After Visit Summary for other counseling recommendations.   Return in about 1 week (around 04/15/2023) for return OB, NST. Her IOL is set for 10/24 at 0800.  Mirna Mires, CNM  04/08/2023 10:49 AM

## 2023-04-08 NOTE — Patient Instructions (Signed)

## 2023-04-08 NOTE — Progress Notes (Signed)
    NURSE VISIT NOTE  Subjective:    Patient ID: Kathy Davenport, female    DOB: June 29, 1991, 32 y.o.   MRN: [id number]  HPI  Patient is a 32 y.o. G3P1001 female who presents for fetal monitoring per order from Autumn Messing, [place].   Objective:    BP 101/64   Pulse 99   Ht 5\' 4"  (1.626 m)   Wt 228 lb 8 oz (103.6 kg)   LMP 07/18/2022   BMI 39.22 kg/m  Estimated Date of Delivery: 04/24/23  [redacted]w[redacted]d  Fetus A Non-Stress Test Interpretation for 04/08/23  Indication: Obesity  Fetal Heart Rate A Mode: External Baseline Rate (A): 130 bpm Variability: Moderate Accelerations: 15 x 15 Decelerations: Variable Multiple birth?: No  Uterine Activity Mode: Toco Contraction Frequency (min): None  Interpretation (Fetal Testing) Nonstress Test Interpretation: Reactive Overall Impression: Reassuring for gestational age   Assessment:   1. Obesity affecting pregnancy in third trimester, unspecified obesity type   2. [redacted] weeks gestation of pregnancy      Plan:   Results reviewed and discussed with patient by  Paula Compton, CNM.     Rocco Serene, LPN

## 2023-04-12 ENCOUNTER — Ambulatory Visit
Admission: RE | Admit: 2023-04-12 | Discharge: 2023-04-12 | Disposition: A | Discharge status: Home / Self Care | Payer: 59 | Source: Ambulatory Visit

## 2023-04-12 DIAGNOSIS — O99213 Obesity complicating pregnancy, third trimester: Secondary | ICD-10-CM | POA: Diagnosis present

## 2023-04-12 DIAGNOSIS — Z3A38 38 weeks gestation of pregnancy: Secondary | ICD-10-CM | POA: Diagnosis not present

## 2023-04-12 DIAGNOSIS — Z3480 Encounter for supervision of other normal pregnancy, unspecified trimester: Secondary | ICD-10-CM | POA: Diagnosis present

## 2023-04-15 ENCOUNTER — Ambulatory Visit (INDEPENDENT_AMBULATORY_CARE_PROVIDER_SITE_OTHER): Payer: 59 | Admitting: Certified Nurse Midwife

## 2023-04-15 VITALS — BP 124/83 | HR 94 | Wt 230.0 lb

## 2023-04-15 DIAGNOSIS — Z3A38 38 weeks gestation of pregnancy: Secondary | ICD-10-CM

## 2023-04-15 DIAGNOSIS — Z3483 Encounter for supervision of other normal pregnancy, third trimester: Secondary | ICD-10-CM | POA: Diagnosis not present

## 2023-04-15 NOTE — Progress Notes (Signed)
    Return Prenatal Note   Subjective   32 y.o. G2P1001 at [redacted]w[redacted]d presents for this follow-up prenatal visit.  Kathy Davenport ready for labor! Reports more symptoms of impending labor-loose stools, increased contractions & vaginal pressure, endorses fetal movement, denies loss of fluid or vaginal bleeding. IOL scheduled 10/24 8am. Reports BPP done Monday but baby did not demonstrate breathing movements. Patient reports: Movement: Present Contractions: Irritability  Objective   Flow sheet Vitals: Pulse Rate: 94 BP: 124/83 Fundal Height: 39 cm Fetal Heart Rate (bpm): 130 (RNST) Presentation: Vertex Dilation: 1.5 Effacement (%): 50 Station: -2 Total weight gain: 23 lb (10.4 kg)  General Appearance  No acute distress, well appearing, and well nourished Pulmonary   Normal work of breathing Neurologic   Alert and oriented to person, place, and time Psychiatric   Mood and affect within normal limits  Assessment/Plan   Plan  32 y.o. G2P1001 at [redacted]w[redacted]d presents for follow-up OB visit. Reviewed prenatal record including previous visit note. 1. Encounter for supervision of other normal pregnancy in third trimester -BPP 6/8 Monday per patient, report not yet available, images reviewed. AFI 15. -Membrane sweep at patient request -Labor/call signs reviewed -IOL already scheduled for 10/24 8am  No orders of the defined types were placed in this encounter.  Return in 1 week (on 04/22/2023) for ROB.   Future Appointments  Date Time Provider Department Center  04/22/2023 10:00 AM AOB-NST ROOM [facility] None  04/22/2023 10:15 AM Dominic, Courtney Heys, CNM [facility] None    For next visit:  continue with routine prenatal care     Dominica Severin, CNM  04/14/2410:47 AM

## 2023-04-15 NOTE — Patient Instructions (Signed)

## 2023-04-18 ENCOUNTER — Other Ambulatory Visit: Payer: Self-pay

## 2023-04-18 ENCOUNTER — Encounter: Payer: Self-pay | Admitting: Certified Nurse Midwife

## 2023-04-18 ENCOUNTER — Observation Stay (HOSPITAL_BASED_OUTPATIENT_CLINIC_OR_DEPARTMENT_OTHER)
Admission: EM | Admit: 2023-04-18 | Discharge: 2023-04-19 | Disposition: A | Discharge status: Home / Self Care | Payer: 59 | Source: Home / Self Care | Admitting: Certified Nurse Midwife

## 2023-04-18 DIAGNOSIS — O471 False labor at or after 37 completed weeks of gestation: Principal | ICD-10-CM | POA: Insufficient documentation

## 2023-04-18 DIAGNOSIS — Z3A39 39 weeks gestation of pregnancy: Secondary | ICD-10-CM | POA: Insufficient documentation

## 2023-04-18 DIAGNOSIS — Z3480 Encounter for supervision of other normal pregnancy, unspecified trimester: Principal | ICD-10-CM

## 2023-04-18 DIAGNOSIS — J4521 Mild intermittent asthma with (acute) exacerbation: Secondary | ICD-10-CM

## 2023-04-18 DIAGNOSIS — O479 False labor, unspecified: Secondary | ICD-10-CM | POA: Diagnosis present

## 2023-04-18 MED ORDER — SOD CITRATE-CITRIC ACID 500-334 MG/5ML PO SOLN: 30.0000 mL | ORAL | Status: DC | PRN

## 2023-04-18 MED ORDER — ACETAMINOPHEN 325 MG PO TABS: 650.0000 mg | ORAL_TABLET | ORAL | Status: DC | PRN

## 2023-04-18 MED ORDER — ONDANSETRON HCL 4 MG/2ML IJ SOLN: 4.0000 mg | Freq: Four times a day (QID) | INTRAMUSCULAR | Status: DC | PRN

## 2023-04-19 ENCOUNTER — Encounter: Payer: Self-pay | Admitting: Certified Nurse Midwife

## 2023-04-19 DIAGNOSIS — O26893 Other specified pregnancy related conditions, third trimester: Secondary | ICD-10-CM | POA: Diagnosis not present

## 2023-04-19 DIAGNOSIS — Z3A39 39 weeks gestation of pregnancy: Secondary | ICD-10-CM | POA: Diagnosis not present

## 2023-04-19 DIAGNOSIS — R109 Unspecified abdominal pain: Secondary | ICD-10-CM

## 2023-04-19 NOTE — Discharge Summary (Signed)
LABOR & DELIVERY OB TRIAGE NOTE  SUBJECTIVE  HPI Kathy Davenport is a 32 y.o. G2P1001 at [redacted]w[redacted]d who presents to Labor & Delivery for contractions. Contractions began at 330pm and continued irregularly through the evening. They are uncomfortable and tight, she is able to talk through them. Endorses fetal movement, denies loss of fluid or vaginal bleeding.  OB History     Gravida  2   Para  1   Term  1   Preterm      AB      Living  1      SAB      IAB      Ectopic      Multiple  0   Live Births  1           Scheduled Meds: Continuous Infusions: PRN Meds:.acetaminophen, ondansetron, sodium citrate-citric acid  OBJECTIVE  Ht 5\' 4"  (1.626 m)   Wt 10.4 kg   LMP 07/18/2022   BMI 3.95 kg/m   General: A&OX4, NAD Heart: regular rate Lungs: normal effort Abdomen: gravid, soft, nontender Cervical exam: Dilation: 3 Effacement (%): 60 Station: -3 Presentation: Vertex Exam by:: Robb Matar CNM   NST I reviewed the NST and it was reactive.  Baseline: 135 Variability: moderate Accelerations: present Decelerations:none Toco: irregular Category  ASSESSMENT Impression  1) Pregnancy at G2P1001, [redacted]w[redacted]d, Estimated Date of Delivery: 04/24/23 2) Reassuring maternal/fetal status 3) Threatened labor at term, cervix unchanged after ambulation & recheck  PLAN Discharge home with term labor precautions. Requests IOL for elevated BMI to moved earlier if possible, date changed from 10/24 to 10/23 at 8am Dominica Severin, CNM 04/19/23  1:06 AM

## 2023-04-21 ENCOUNTER — Encounter: Payer: Self-pay | Admitting: Obstetrics

## 2023-04-21 ENCOUNTER — Inpatient Hospital Stay
Admission: RE | Admit: 2023-04-21 | Discharge: 2023-04-23 | DRG: 807 | Disposition: A | Discharge status: Home / Self Care | Payer: 59 | Source: Ambulatory Visit | Attending: Obstetrics | Admitting: Obstetrics

## 2023-04-21 ENCOUNTER — Other Ambulatory Visit: Payer: Self-pay

## 2023-04-21 ENCOUNTER — Inpatient Hospital Stay: Payer: 59 | Admitting: Anesthesiology

## 2023-04-21 DIAGNOSIS — Z87891 Personal history of nicotine dependence: Secondary | ICD-10-CM | POA: Diagnosis not present

## 2023-04-21 DIAGNOSIS — O99214 Obesity complicating childbirth: Secondary | ICD-10-CM | POA: Diagnosis present

## 2023-04-21 DIAGNOSIS — O99213 Obesity complicating pregnancy, third trimester: Secondary | ICD-10-CM

## 2023-04-21 DIAGNOSIS — J4521 Mild intermittent asthma with (acute) exacerbation: Principal | ICD-10-CM

## 2023-04-21 DIAGNOSIS — O323XX0 Maternal care for face, brow and chin presentation, not applicable or unspecified: Secondary | ICD-10-CM | POA: Diagnosis present

## 2023-04-21 DIAGNOSIS — O9962 Diseases of the digestive system complicating childbirth: Secondary | ICD-10-CM | POA: Diagnosis present

## 2023-04-21 DIAGNOSIS — K219 Gastro-esophageal reflux disease without esophagitis: Secondary | ICD-10-CM | POA: Diagnosis present

## 2023-04-21 DIAGNOSIS — Z3A39 39 weeks gestation of pregnancy: Secondary | ICD-10-CM | POA: Diagnosis not present

## 2023-04-21 DIAGNOSIS — O26893 Other specified pregnancy related conditions, third trimester: Secondary | ICD-10-CM | POA: Diagnosis present

## 2023-04-21 DIAGNOSIS — E663 Overweight: Secondary | ICD-10-CM | POA: Diagnosis not present

## 2023-04-21 DIAGNOSIS — Z823 Family history of stroke: Secondary | ICD-10-CM | POA: Diagnosis not present

## 2023-04-21 DIAGNOSIS — Z3480 Encounter for supervision of other normal pregnancy, unspecified trimester: Secondary | ICD-10-CM

## 2023-04-21 LAB — CBC
HCT: 32.4 % — ABNORMAL LOW (ref 36.0–46.0)
Hemoglobin: 10.9 g/dL — ABNORMAL LOW (ref 12.0–15.0)
MCH: 31.6 pg (ref 26.0–34.0)
MCHC: 33.6 g/dL (ref 30.0–36.0)
MCV: 93.9 fL (ref 80.0–100.0)
Platelets: 190 10*3/uL (ref 150–400)
RBC: 3.45 MIL/uL — ABNORMAL LOW (ref 3.87–5.11)
RDW: 14.9 % (ref 11.5–15.5)
WBC: 9 10*3/uL (ref 4.0–10.5)
nRBC: 0 % (ref 0.0–0.2)

## 2023-04-21 LAB — ABO/RH: ABO/RH(D): A POS

## 2023-04-21 LAB — TYPE AND SCREEN
ABO/RH(D): A POS
Antibody Screen: NEGATIVE

## 2023-04-21 MED ORDER — OXYTOCIN BOLUS FROM INFUSION
333.0000 mL | Freq: Once | INTRAVENOUS | Status: AC
  Administered 2023-04-22: 333 mL via INTRAVENOUS

## 2023-04-21 MED ORDER — PHENYLEPHRINE 80 MCG/ML (10ML) SYRINGE FOR IV PUSH (FOR BLOOD PRESSURE SUPPORT)
80.0000 ug | PREFILLED_SYRINGE | INTRAVENOUS | Status: DC | PRN
  Filled 2023-04-21: qty 10

## 2023-04-21 MED ORDER — FENTANYL CITRATE (PF) 100 MCG/2ML IJ SOLN
INTRAMUSCULAR | Status: AC
  Filled 2023-04-21: qty 2

## 2023-04-21 MED ORDER — LIDOCAINE-EPINEPHRINE (PF) 1.5 %-1:200000 IJ SOLN
INTRAMUSCULAR | Status: DC | PRN
  Administered 2023-04-21: 3 mL via EPIDURAL

## 2023-04-21 MED ORDER — LACTATED RINGERS IV SOLN: INTRAVENOUS | Status: DC

## 2023-04-21 MED ORDER — LIDOCAINE HCL (PF) 1 % IJ SOLN
INTRAMUSCULAR | Status: DC | PRN
  Administered 2023-04-21: 2 mL via INTRADERMAL

## 2023-04-21 MED ORDER — OXYTOCIN-SODIUM CHLORIDE 30-0.9 UT/500ML-% IV SOLN
1.0000 m[IU]/min | INTRAVENOUS | Status: DC
  Administered 2023-04-21: 2 m[IU]/min via INTRAVENOUS

## 2023-04-21 MED ORDER — LIDOCAINE HCL (PF) 1 % IJ SOLN
INTRAMUSCULAR | Status: AC
  Filled 2023-04-21: qty 30

## 2023-04-21 MED ORDER — ACETAMINOPHEN 325 MG PO TABS: 650.0000 mg | ORAL_TABLET | ORAL | Status: DC | PRN

## 2023-04-21 MED ORDER — DIPHENHYDRAMINE HCL 50 MG/ML IJ SOLN: 12.5000 mg | INTRAMUSCULAR | Status: DC | PRN

## 2023-04-21 MED ORDER — TERBUTALINE SULFATE 1 MG/ML IJ SOLN: 0.2500 mg | Freq: Once | INTRAMUSCULAR | Status: DC | PRN

## 2023-04-21 MED ORDER — MISOPROSTOL 25 MCG QUARTER TABLET
25.0000 ug | ORAL_TABLET | Freq: Once | ORAL | Status: AC
  Administered 2023-04-21: 25 ug via ORAL
  Filled 2023-04-21: qty 1

## 2023-04-21 MED ORDER — MISOPROSTOL 200 MCG PO TABS
ORAL_TABLET | ORAL | Status: AC
  Filled 2023-04-21: qty 4

## 2023-04-21 MED ORDER — BUPIVACAINE HCL (PF) 0.25 % IJ SOLN
INTRAMUSCULAR | Status: DC | PRN
  Administered 2023-04-21: 5 mL via EPIDURAL
  Administered 2023-04-21: 4 mL via EPIDURAL

## 2023-04-21 MED ORDER — ONDANSETRON HCL 4 MG/2ML IJ SOLN: 4.0000 mg | Freq: Four times a day (QID) | INTRAMUSCULAR | Status: DC | PRN

## 2023-04-21 MED ORDER — EPHEDRINE 5 MG/ML INJ
10.0000 mg | INTRAVENOUS | Status: AC | PRN
  Administered 2023-04-21 (×2): 10 mg via INTRAVENOUS
  Filled 2023-04-21: qty 5

## 2023-04-21 MED ORDER — LACTATED RINGERS IV SOLN
500.0000 mL | INTRAVENOUS | Status: DC | PRN
  Administered 2023-04-21: 500 mL via INTRAVENOUS

## 2023-04-21 MED ORDER — FENTANYL-BUPIVACAINE-NACL 0.5-0.125-0.9 MG/250ML-% EP SOLN
EPIDURAL | Status: AC
  Filled 2023-04-21: qty 250

## 2023-04-21 MED ORDER — LIDOCAINE HCL (PF) 1 % IJ SOLN: 30.0000 mL | INTRAMUSCULAR | Status: DC | PRN

## 2023-04-21 MED ORDER — SOD CITRATE-CITRIC ACID 500-334 MG/5ML PO SOLN: 30.0000 mL | ORAL | Status: DC | PRN

## 2023-04-21 MED ORDER — OXYTOCIN-SODIUM CHLORIDE 30-0.9 UT/500ML-% IV SOLN
2.5000 [IU]/h | INTRAVENOUS | Status: DC
  Administered 2023-04-22: 2.5 [IU]/h via INTRAVENOUS
  Filled 2023-04-21: qty 500

## 2023-04-21 MED ORDER — PHENYLEPHRINE 80 MCG/ML (10ML) SYRINGE FOR IV PUSH (FOR BLOOD PRESSURE SUPPORT)
80.0000 ug | PREFILLED_SYRINGE | INTRAVENOUS | Status: DC | PRN
  Administered 2023-04-21: 80 ug via INTRAVENOUS

## 2023-04-21 MED ORDER — AMMONIA AROMATIC IN INHA
RESPIRATORY_TRACT | Status: AC
  Filled 2023-04-21: qty 10

## 2023-04-21 MED ORDER — LACTATED RINGERS IV SOLN
500.0000 mL | Freq: Once | INTRAVENOUS | Status: AC
  Administered 2023-04-21: 500 mL via INTRAVENOUS

## 2023-04-21 MED ORDER — OXYCODONE-ACETAMINOPHEN 5-325 MG PO TABS: 2.0000 | ORAL_TABLET | ORAL | Status: DC | PRN

## 2023-04-21 MED ORDER — MISOPROSTOL 25 MCG QUARTER TABLET
25.0000 ug | ORAL_TABLET | Freq: Once | ORAL | Status: AC
  Administered 2023-04-21: 25 ug via VAGINAL
  Filled 2023-04-21: qty 1

## 2023-04-21 MED ORDER — EPHEDRINE 5 MG/ML INJ: 10.0000 mg | INTRAVENOUS | Status: DC | PRN

## 2023-04-21 MED ORDER — OXYCODONE-ACETAMINOPHEN 5-325 MG PO TABS: 1.0000 | ORAL_TABLET | ORAL | Status: DC | PRN

## 2023-04-21 MED ORDER — OXYTOCIN 10 UNIT/ML IJ SOLN
INTRAMUSCULAR | Status: AC
  Filled 2023-04-21: qty 2

## 2023-04-21 MED ORDER — FENTANYL-BUPIVACAINE-NACL 0.5-0.125-0.9 MG/250ML-% EP SOLN
10.0000 mL/h | EPIDURAL | Status: DC | PRN
  Administered 2023-04-21: 12 mL/h via EPIDURAL

## 2023-04-21 MED ORDER — FENTANYL CITRATE (PF) 100 MCG/2ML IJ SOLN
50.0000 ug | Freq: Once | INTRAMUSCULAR | Status: AC
  Administered 2023-04-21: 50 ug via INTRAVENOUS

## 2023-04-21 NOTE — Progress Notes (Signed)
Kathy Davenport is a 32 y.o. G2P1001 at [redacted]w[redacted]d who continues with induction of labor. Subjective: She is still comfortable with her epidural and has been able to rest. Feels increased pressure.  Objective: BP 127/82   Pulse 93   Temp 97.7 F (36.5 C) (Oral)   Resp 16   Ht 5\' 4"  (1.626 m)   Wt 104.3 kg   LMP 07/18/2022   SpO2 99%   BMI 39.48 kg/m  I/O last 3 completed shifts: In: 1282.9 [I.V.:1266.8; Other:16.1] Out: 450 [Urine:450] No intake/output data recorded.  FHT:  FHR: 120-125 baseline bpm, variability: moderate,  accelerations:  Present,  decelerations:  Absent UC:   regular, every 2-4 minutes SVE:   5-6cms /60%/FACE presentation noted. ? Mentum anterior  Labs: Lab Results  Component Value Date   WBC 9.0 04/21/2023   HGB 10.9 (L) 04/21/2023   HCT 32.4 (L) 04/21/2023   MCV 93.9 04/21/2023   PLT 190 04/21/2023    Assessment / Plan:  Face presentation confirmed by labor RN  Fetal Wellbeing:  Category II- several variables noted during the cervical exam Pain Control:  Epidural I/D:  n/a Anticipated MOD:   Dr. Valentino Saxon contacted and notified of face rpesentation. FHT recording reviewed with Dr. Valentino Saxon. Will continue with labor management and pitocin augmentation. Will recheck once contraction frequency improves. Will request cherry's presence at delivery. Careful monitoring.  Mirna Mires, CNM 04/21/2023, 9:07 PM

## 2023-04-21 NOTE — Progress Notes (Signed)
Kathy Davenport is a 32 y.o. G2P1001 at [redacted]w[redacted]d by LMP admitted for induction of labor due to BMI.  Subjective: Kathy Davenport is up and walking around the room, coping well with contractions.   Objective: BP 118/78   Pulse 85   Temp 98.1 F (36.7 C) (Oral)   Resp 16   Ht 5\' 4"  (1.626 m)   Wt 104.3 kg   LMP 07/18/2022   BMI 39.48 kg/m  No intake/output data recorded. No intake/output data recorded.  FHT:  FHR: 130 bpm, variability: moderate,  accelerations:  Present,  decelerations:  Absent UC:   irregular, every 2-5 minutes SVE:   Dilation: 3 Effacement (%): Thick Station: -3 Exam by:: Eunice Blase CNM  Labs: Lab Results  Component Value Date   WBC 9.0 04/21/2023   HGB 10.9 (L) 04/21/2023   HCT 32.4 (L) 04/21/2023   MCV 93.9 04/21/2023   PLT 190 04/21/2023    Assessment / Plan: -Induction of labor for BMI 39. 48 -FHT Category I; will continue continuous fetal monitoring  -See above for cervical exam; will now start pitocin.  -Discussed AROM after epidural -Continue induction orders.   Labor: Progressing normally Preeclampsia:   n/a Fetal Wellbeing:  Category I Pain Control:   no medications at this time; planning epidural  I/D:  n/a Anticipated MOD:  NSVD  [name], Student-MidWife 04/21/2023, 1:35 PM  Paula Compton, CNM present for all portions of care.

## 2023-04-21 NOTE — Progress Notes (Signed)
Kathy Davenport is a 32 y.o. G2P1001 at [redacted]w[redacted]d by LMP. She continues IOL for BMI  Subjective: Feeling constant rectal pressure.  Continuing to get relief from epidural.   Objective: BP 127/82   Pulse 93   Temp 97.7 F (36.5 C) (Oral)   Resp 16   Ht 5\' 4"  (1.626 m)   Wt 104.3 kg   LMP 07/18/2022   SpO2 99%   BMI 39.48 kg/m  I/O last 3 completed shifts: In: 1282.9 [I.V.:1266.8; Other:16.1] Out: 450 [Urine:450] Total I/O In: -  Out: 225 [Urine:225]  FHT:  FHR: 120 bpm, variability: moderate,  accelerations:  Present,  decelerations:  Present . Variables  noted with contractions  UC:   regular, every 2-3 minutes SVE:   Dilation: 10 Effacement (%): 100 Station: -2 Exam by:: Liana Crocker CNM  Labs: Lab Results  Component Value Date   WBC 9.0 04/21/2023   HGB 10.9 (L) 04/21/2023   HCT 32.4 (L) 04/21/2023   MCV 93.9 04/21/2023   PLT 190 04/21/2023    Assessment / Plan: Complete dilation noted.  Face presentation appears to have resolved; MD [facility] aware.  Will begin pushing.   Labor: Progressing normally Preeclampsia:   n/a Fetal Wellbeing:  Category II Pain Control:  Epidural I/D:  n/a Anticipated MOD:  NSVD  [name], Student-MidWife 04/21/2023, 10:30 PM  Paula Compton, CNM present for all portions of care.

## 2023-04-21 NOTE — Anesthesia Procedure Notes (Signed)
Epidural Patient location during procedure: OB  Staffing Resident/CRNA: Mathews Argyle, CRNA Performed: resident/CRNA   Preanesthetic Checklist Completed: patient identified, IV checked, site marked, risks and benefits discussed, surgical consent, monitors and equipment checked, pre-op evaluation and timeout performed  Epidural Patient position: sitting Prep: ChloraPrep and site prepped and draped Patient monitoring: heart rate, continuous pulse ox and blood pressure Approach: midline Location: L4-L5 Injection technique: LOR saline  Needle:  Needle type: Tuohy  Needle gauge: 17 G Needle length: 9 cm and 9 Needle insertion depth: 8 cm Catheter type: closed end flexible Catheter size: 19 Gauge Catheter at skin depth: 13 cm Test dose: negative and 1.5% lidocaine with Epi 1:200 K  Assessment Events: blood not aspirated, no cerebrospinal fluid, injection not painful, no injection resistance, no paresthesia and negative IV test  Additional Notes   Patient tolerated the insertion well without complications.Reason for block:procedure for pain

## 2023-04-21 NOTE — Anesthesia Preprocedure Evaluation (Signed)
Anesthesia Evaluation  Patient identified by MRN, date of birth, ID band Patient awake    Reviewed: Allergy & Precautions, H&P , NPO status , Patient's Chart, lab work & pertinent test results  Airway Mallampati: III  TM Distance: >3 FB Neck ROM: full    Dental   Pulmonary asthma , former smoker          Cardiovascular hypertension,      Neuro/Psych   Anxiety Depression       GI/Hepatic Neg liver ROS,GERD  ,,  Endo/Other  negative endocrine ROS    Renal/GU negative Renal ROS  negative genitourinary   Musculoskeletal   Abdominal   Peds  Hematology   Anesthesia Other Findings   Reproductive/Obstetrics (+) Pregnancy                             Anesthesia Physical Anesthesia Plan  ASA: 2  Anesthesia Plan: Epidural   Post-op Pain Management:    Induction:   PONV Risk Score and Plan:   Airway Management Planned:   Additional Equipment:   Intra-op Plan:   Post-operative Plan:   Informed Consent:   Plan Discussed with: CRNA  Anesthesia Plan Comments:        Anesthesia Quick Evaluation

## 2023-04-21 NOTE — Progress Notes (Signed)
Kathy Davenport is a 32 y.o. G2P1001 at [redacted]w[redacted]d by LMP. Here for IOL for BMI 39   Subjective: Still good pain control with epidural.  FHR Cat II  Objective: BP 127/82   Pulse 93   Temp 98.2 F (36.8 C)   Resp 16   Ht 5\' 4"  (1.626 m)   Wt 104.3 kg   LMP 07/18/2022   SpO2 99%   BMI 39.48 kg/m  I/O last 3 completed shifts: In: 1282.9 [I.V.:1266.8; Other:16.1] Out: 450 [Urine:450] Total I/O In: -  Out: 225 [Urine:225]  FHT:  FHR: 145 bpm, variability: moderate,  accelerations:  Present,  decelerations:  Present (variables) UC:   regular, every 2-4 minutes SVE:   Dilation: 10 Effacement (%): 100 Station: 0 Exam by:: Sema Stangler, SNM  Labs: Lab Results  Component Value Date   WBC 9.0 04/21/2023   HGB 10.9 (L) 04/21/2023   HCT 32.4 (L) 04/21/2023   MCV 93.9 04/21/2023   PLT 190 04/21/2023    Assessment / Plan: -Rechecked around 2300; some descent. Attempted a few pushes with little subsequent descent and deep variables with pushing.  -Consulted MD Valentino Saxon; plan to re-check in approximately 1-1.5 hours to allow further passive descent.  -Plan to re-start pitocin and titrate as FHR allows (was previously turned off d/t recurrent deep variables).  -Repositioned on L side with peanut ball.   Preeclampsia:   n/a Fetal Wellbeing:  Category II Pain Control:  Epidural I/D:  n/a Anticipated MOD:  NSVD  [name], Student-MidWife 04/21/2023, 11:35 PM  Paula Compton, CNM present for all portions of care.

## 2023-04-21 NOTE — Progress Notes (Signed)
Kathy Davenport is a 32 y.o. G2P1001 at [redacted]w[redacted]d by LMP admitted for induction of labor due to BMI.  Subjective: Talking through contractions; epidural placed and she is resting comfortably.   Objective: BP 101/68   Pulse 89   Temp 97.7 F (36.5 C) (Oral)   Resp 16   Ht 5\' 4"  (1.626 m)   Wt 104.3 kg   LMP 07/18/2022   SpO2 97%   BMI 39.48 kg/m  No intake/output data recorded. No intake/output data recorded.  FHT:  FHR: 130 bpm, variability: moderate,  accelerations:  Present,  decelerations:  Absent UC:   regular, every 2-4 minutes SVE:   Dilation: 3 Effacement (%): Thick Station: -3 Exam by:: Eunice Blase CNM  Labs: Lab Results  Component Value Date   WBC 9.0 04/21/2023   HGB 10.9 (L) 04/21/2023   HCT 32.4 (L) 04/21/2023   MCV 93.9 04/21/2023   PLT 190 04/21/2023    Assessment / Plan: -Induction of labor due to BMI.  -FHT Category I; continuous monitoring indicated.  -Pitocin started at 1354 after one dose of cytotec.  -Epidural placed by anesthesia for labor coping.  -Discussed re-check with foley placement, will discuss AROM at that time pending cervical exam.   Labor: Progressing on Pitocin, will continue to increase then AROM Preeclampsia:   n/a Fetal Wellbeing:  Category I Pain Control:  Epidural I/D:  n/a Anticipated MOD:  NSVD  [name], Student-MidWife 04/21/2023, 4:11 PM  Paula Compton, CNM present for all portions of care.

## 2023-04-21 NOTE — H&P (Signed)
Kathy Davenport is a 32 y.o. female presenting for induction of labor due to BMI 39.48. OB History     Gravida  2   Para  1   Term  1   Preterm      AB      Living  1      SAB      IAB      Ectopic      Multiple  0   Live Births  1          Past Medical History:  Diagnosis Date   ADHD (attention deficit hyperactivity disorder)    ADHD (attention deficit hyperactivity disorder)    Was on adderall at beginning of pregnancy, stopped when found out she was pregnant   Anxiety    Anxiety    Was on ativan at beginning of pregnancy, tapered and now completely off     10/30/2020  -pt reports 2-3 panic attacks per week, reporting intense fear, heart racing, withdrawing from people around her, shortness of breath  -pt has used hydroxyzine in the past with success  -Atarax RX given for PRN use  -pt currently seeing therapist weekly, not connected with psychiatrist d/t insurance change  -re   Asthma    Depression    Depression 08/07/2020   On lexapro, was on ativan prior to pregnancy, tapered when she found out  Doing well now, recommend stay on lexapro for now   Hypertension    Labor and delivery, indication for care 02/21/2023   Major depressive disorder, recurrent, 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 Social History:  reports that she has quit smoking. Her smoking use included e-cigarettes. She has never used smokeless tobacco. She reports that she does not currently use alcohol. She reports that she does not currently use drugs after having used the following  drugs: Marijuana.     Maternal Diabetes: No Genetic Screening: Normal Maternal Ultrasounds/Referrals: Normal Fetal Ultrasounds or other Referrals:  Referred to Materal Fetal Medicine  Maternal Substance Abuse:  No Significant Maternal Medications:  Meds include: Other: lexapro, atarax  Significant Maternal Lab Results:  None Number of Prenatal Visits:greater than 3 verified prenatal visits Maternal Vaccinations:TDap and Flu Other Comments:  None  Review of Systems  Constitutional: Negative.   HENT: Negative.    Eyes: Negative.   Respiratory: Negative.    Cardiovascular: Negative.   Gastrointestinal: Negative.   Endocrine: Negative.   Genitourinary: Negative.   Musculoskeletal: Negative.   Skin: Negative.   Allergic/Immunologic: Negative.   Neurological: Negative.   Hematological: Negative.   Psychiatric/Behavioral: Negative.     Maternal Medical History:  Reason for admission: IOL for increased BMI  Contractions: Frequency: rare.   Perceived severity is mild.   Fetal activity: Perceived fetal activity is normal.   Prenatal complications: no prenatal complications Prenatal Complications - Diabetes: none.   Dilation: 1.5 Effacement (%): 0 Station: -3 Exam by:: [employer] SNM and Kathy Lunsford RN Blood pressure 119/68, pulse (!) 116, temperature 97.8 F (36.6 C), temperature source Oral, resp. rate 16, height 5\' 4"  (1.626 m), weight 104.3 kg, last menstrual  period 07/18/2022, currently breastfeeding. Maternal Exam:  Introitus: Normal vulva.   Physical Exam Vitals reviewed.  Constitutional:      Appearance: Normal appearance.  HENT:     Head: Normocephalic.  Cardiovascular:     Rate and Rhythm: Normal rate and regular rhythm.     Pulses: Normal pulses.     Heart sounds: Normal heart sounds.  Pulmonary:     Effort: Pulmonary effort is normal.     Breath sounds: Normal breath sounds.  Abdominal:     General: Bowel sounds are normal.     Palpations:  Abdomen is soft.     Comments: Gravid abdomen  Genitourinary:    General: Normal vulva.  Musculoskeletal:        General: Normal range of motion.     Cervical back: Normal range of motion and neck supple.  Skin:    General: Skin is warm and dry.  Neurological:     Mental Status: She is alert and oriented to person, place, and time.  Psychiatric:        Mood and Affect: Mood normal.        Behavior: Behavior normal.     Prenatal labs: ABO, Rh: --/--/PENDING (10/23 1610) Antibody: PENDING (10/23 0840) Rubella: 2.76 (05/14 1521) RPR: Non Reactive (08/08 0937)  HBsAg: Negative (05/14 1521)  HIV: Non Reactive (08/08 9604)  GBS: Negative/-- (09/27 0915)   Assessment/Plan: 32 yo G2P1 for IOL d/t BMI 39.48. IUP 39 weeks 4 days dated by LMP.  Cervical exam: 1.5/thick/high  Category I FHT GBS negative  Plan:  -Routine admission orders and labs -IV  -Regular Diet at this time -Cytotec placed -Continuous fetal monitoring at this time -Recheck cervix 4 hrs post cytotec placement -Epidural planned for labor coping  -Induction process reviewed with Kathy Davenport and her husband; they verbalize understanding. All questions answered at this time.    Kathy Davenport 04/21/2023, 9:28 AM  Kathy Davenport, SNM 04/21/2023 [phone]

## 2023-04-21 NOTE — Progress Notes (Signed)
Kathy Davenport is a 32 y.o. G2P1001 at [redacted]w[redacted]d by LMP continues induction of labor for BMI of 37.48  Subjective: Feeling some intermittent pressure but still quite comfortable with epidural in place.   Objective: BP 127/82   Pulse 93   Temp 97.7 F (36.5 C) (Oral)   Resp 16   Ht 5\' 4"  (1.626 m)   Wt 104.3 kg   LMP 07/18/2022   SpO2 99%   BMI 39.48 kg/m  I/O last 3 completed shifts: In: 1282.9 [I.V.:1266.8; Other:16.1] Out: 450 [Urine:450] No intake/output data recorded.  FHT:  FHR: 125 bpm, variability: moderate,  accelerations:  Present,  decelerations:  Absent UC:   regular, every 2-3 minutes SVE:   Dilation: 5 Effacement (%): 60 Station: -2 Exam by:: [employer]  Labs: Lab Results  Component Value Date   WBC 9.0 04/21/2023   HGB 10.9 (L) 04/21/2023   HCT 32.4 (L) 04/21/2023   MCV 93.9 04/21/2023   PLT 190 04/21/2023    Assessment / Plan: -AROM 1832- clear fluid. 5/60/-2 at this time.  -Downtitrated pitocin by 2 mu/min d/t contractions getting closer together after AROM.  -Will continue to titrate pitocin as FHR and contractions allow -Recheck in about 2 hrs or if feeling constant pressure.  Labor: Progressing normally  Preeclampsia:   n/a Fetal Wellbeing:  Category I Uterine Contractions: q2-3 minutes  Pain Control:  Epidural I/D:  n/a Anticipated MOD:  NSVD  [name], Student-MidWife 04/21/2023, 7:55 PM  Paula Compton, CNM present for all portions of care.

## 2023-04-21 NOTE — Progress Notes (Signed)
Kathy Davenport is a 32 y.o. G2P1001 at [redacted]w[redacted]d by LMP who continues with induction of labor.   Subjective: Comfortable with epidural. Some episodes of symptomatic hypotension post-epidural placement; treated with ephedrine and phenylephrine, now normotensive and feeling better.   Objective: BP 106/66   Pulse 83   Temp 97.7 F (36.5 C) (Oral)   Resp 16   Ht 5\' 4"  (1.626 m)   Wt 104.3 kg   LMP 07/18/2022   SpO2 100%   BMI 39.48 kg/m  No intake/output data recorded. Total I/O In: 1282.9 [I.V.:1266.8; Other:16.1] Out: -   FHT:  FHR: 135 bpm, variability: moderate,  accelerations:  Present,  decelerations:  Absent UC:   regular, every 2-4 minutes SVE:   Dilation: 5 Effacement (%): 50 Station: -3 Exam by:: Fleet Contras Student midwife  Labs: Lab Results  Component Value Date   WBC 9.0 04/21/2023   HGB 10.9 (L) 04/21/2023   HCT 32.4 (L) 04/21/2023   MCV 93.9 04/21/2023   PLT 190 04/21/2023    Assessment / Plan: Induction of labor due to BMI,  progressing well on pitocin -Will continue to up-titrate pitocin and re-evaluate for AROM at next check; head is ballottable at this time. -Continuous fetal monitoring.  -Continue induction orders.   Labor: Progressing on Pitocin, will continue to increase then AROM Preeclampsia:   n/a Fetal Wellbeing:  Category I Uterine Contractions: 2-4 mins  Pain Control:  Epidural I/D:  n/a Anticipated MOD:  NSVD  [name], Student-MidWife 04/21/2023, 5:09 PM  Paula Compton, CNM present for all portions of care.

## 2023-04-22 ENCOUNTER — Encounter: Payer: Self-pay | Admitting: Obstetrics and Gynecology

## 2023-04-22 ENCOUNTER — Encounter: Payer: 59 | Admitting: Licensed Practical Nurse

## 2023-04-22 ENCOUNTER — Other Ambulatory Visit: Payer: 59

## 2023-04-22 ENCOUNTER — Telehealth: Payer: Self-pay

## 2023-04-22 DIAGNOSIS — O99214 Obesity complicating childbirth: Principal | ICD-10-CM

## 2023-04-22 DIAGNOSIS — E663 Overweight: Secondary | ICD-10-CM | POA: Diagnosis not present

## 2023-04-22 DIAGNOSIS — Z3A39 39 weeks gestation of pregnancy: Secondary | ICD-10-CM

## 2023-04-22 LAB — CBC
HCT: 35.8 % — ABNORMAL LOW (ref 36.0–46.0)
Hemoglobin: 11.8 g/dL — ABNORMAL LOW (ref 12.0–15.0)
MCH: 31.1 pg (ref 26.0–34.0)
MCHC: 33 g/dL (ref 30.0–36.0)
MCV: 94.5 fL (ref 80.0–100.0)
Platelets: 216 10*3/uL (ref 150–400)
RBC: 3.79 MIL/uL — ABNORMAL LOW (ref 3.87–5.11)
RDW: 14.9 % (ref 11.5–15.5)
WBC: 15 10*3/uL — ABNORMAL HIGH (ref 4.0–10.5)
nRBC: 0 % (ref 0.0–0.2)

## 2023-04-22 LAB — RPR: RPR Ser Ql: NONREACTIVE

## 2023-04-22 MED ORDER — PRENATAL MULTIVITAMIN CH
1.0000 | ORAL_TABLET | Freq: Every day | ORAL | Status: DC
  Administered 2023-04-22 – 2023-04-23 (×2): 1 via ORAL
  Filled 2023-04-22 (×2): qty 1

## 2023-04-22 MED ORDER — DIBUCAINE (PERIANAL) 1 % EX OINT
1.0000 | TOPICAL_OINTMENT | CUTANEOUS | Status: DC | PRN
  Filled 2023-04-22 (×2): qty 28

## 2023-04-22 MED ORDER — BENZOCAINE-MENTHOL 20-0.5 % EX AERO
INHALATION_SPRAY | CUTANEOUS | Status: AC
  Filled 2023-04-22: qty 56

## 2023-04-22 MED ORDER — ONDANSETRON HCL 4 MG PO TABS: 4.0000 mg | ORAL_TABLET | ORAL | Status: DC | PRN

## 2023-04-22 MED ORDER — ZOLPIDEM TARTRATE 5 MG PO TABS: 5.0000 mg | ORAL_TABLET | Freq: Every evening | ORAL | Status: DC | PRN

## 2023-04-22 MED ORDER — ESCITALOPRAM OXALATE 10 MG PO TABS
10.0000 mg | ORAL_TABLET | Freq: Every day | ORAL | Status: DC
  Administered 2023-04-22 – 2023-04-23 (×2): 10 mg via ORAL
  Filled 2023-04-22 (×2): qty 1

## 2023-04-22 MED ORDER — IBUPROFEN 600 MG PO TABS
600.0000 mg | ORAL_TABLET | Freq: Four times a day (QID) | ORAL | Status: DC
  Administered 2023-04-22 – 2023-04-23 (×6): 600 mg via ORAL
  Filled 2023-04-22 (×6): qty 1

## 2023-04-22 MED ORDER — WITCH HAZEL-GLYCERIN EX PADS
1.0000 | MEDICATED_PAD | CUTANEOUS | Status: DC | PRN
  Filled 2023-04-22: qty 100

## 2023-04-22 MED ORDER — DOCUSATE SODIUM 100 MG PO CAPS
100.0000 mg | ORAL_CAPSULE | Freq: Two times a day (BID) | ORAL | Status: DC
  Administered 2023-04-23: 100 mg via ORAL
  Filled 2023-04-22: qty 1

## 2023-04-22 MED ORDER — ACETAMINOPHEN 325 MG PO TABS
650.0000 mg | ORAL_TABLET | ORAL | Status: DC | PRN
  Administered 2023-04-22 – 2023-04-23 (×2): 650 mg via ORAL
  Filled 2023-04-22 (×2): qty 2

## 2023-04-22 MED ORDER — ONDANSETRON HCL 4 MG/2ML IJ SOLN: 4.0000 mg | INTRAMUSCULAR | Status: DC | PRN

## 2023-04-22 MED ORDER — COCONUT OIL OIL: 1.0000 | TOPICAL_OIL | Status: DC | PRN

## 2023-04-22 MED ORDER — WITCH HAZEL-GLYCERIN EX PADS
MEDICATED_PAD | CUTANEOUS | Status: AC
  Filled 2023-04-22: qty 100

## 2023-04-22 MED ORDER — BENZOCAINE-MENTHOL 20-0.5 % EX AERO
1.0000 | INHALATION_SPRAY | CUTANEOUS | Status: DC | PRN
  Filled 2023-04-22 (×2): qty 56

## 2023-04-22 MED ORDER — DIPHENHYDRAMINE HCL 25 MG PO CAPS: 25.0000 mg | ORAL_CAPSULE | Freq: Four times a day (QID) | ORAL | Status: DC | PRN

## 2023-04-22 MED ORDER — SIMETHICONE 80 MG PO CHEW
80.0000 mg | CHEWABLE_TABLET | ORAL | Status: DC | PRN
Start: 2023-04-22 — End: 2023-04-23
  Administered 2023-04-22 – 2023-04-23 (×2): 80 mg via ORAL
  Filled 2023-04-22 (×2): qty 1

## 2023-04-22 NOTE — Lactation Note (Signed)
This note was copied from a baby's chart. Lactation Consultation Note  Patient Name: Kathy Davenport RUEAV'W Date: 04/22/2023 Age:34 hours Reason for consult: Initial assessment   Maternal Data Does the patient have breastfeeding experience prior to this delivery?: Yes How long did the patient breastfeed?: 66yrs  Lactation to room for initial assessment w/ a P2 patient and a 8hr old baby Kathy "Cleo".  This is an experienced breastfeeding parent.  This was a SVD.    Patient stated that breastfeeding is going well.  Patient breastfed her first child for 75yrs with no issues.  Patient did ask if LC had suggestions for breastpumps for large breast women.   Feeding Mother's Current Feeding Choice: Breast Milk  Infant was feeding upon entry into the room.  LC made adjustments to infants positioning at the breast, faced infant belly to belly to mom.  Patient denied any pain and expressed that she was feeling strong tugs.   LC provided some assistance with extra pillow support to help mom in supporting baby as well as her breast.  LATCH Score Latch: Repeated attempts needed to sustain latch, nipple held in mouth throughout feeding, stimulation needed to elicit sucking reflex.  Audible Swallowing: A few with stimulation  Type of Nipple: Everted at rest and after stimulation  Comfort (Breast/Nipple): Soft / non-tender  Hold (Positioning): Assistance needed to correctly position infant at breast and maintain latch.  LATCH Score: 7  Interventions Interventions: Breast feeding basics reviewed;Assisted with latch;Skin to skin;Adjust position;Support pillows;Position options;Breast compression;Education  LC provided education on the following;  milk production expectations, hunger cues, day 1/2 wet/dirty diapers, hand expression, cluster feeding, benefits of STS and arousing infant for a feeding.  Lactation informed patient of feeding infant at least 8 or more times w/in a 24hr period but  not exceeding 3hrs. Patient verbalized understanding.   Discharge Pump: Personal ([employer])  LC provided a Medicaid form with phone numbers so she may reach out to her insurance. Engorgement education was provided to patient.   Consult Status Consult Status: Follow-up Follow-up type: In-patient    Yvette Rack Free 04/22/2023, 12:04 PM

## 2023-04-22 NOTE — Discharge Summary (Signed)
Postpartum Discharge Summary  Date of Service updated 04/23/2023     Patient Name: Kathy Davenport DOB: 28-Dec-1990 MRN: [id number]  Date of admission: 04/21/2023 Delivery date:04/22/2023 Delivering provider: Cindra Eves Davenport Date of discharge: 04/23/2023  Admitting diagnosis: Labor and delivery indication for care or intervention [O75.9] Intrauterine pregnancy: [redacted]w[redacted]d     Secondary diagnosis:  Principal Problem:   Labor and delivery indication for care or intervention Active Problems:   Postpartum care following vaginal delivery  Additional problems: none    Discharge diagnosis: Term Pregnancy Delivered                                              Post partum procedures: NA Augmentation: AROM and Pitocin Complications: None  Hospital course: Induction of Labor With Vaginal Delivery   32 y.o. yo G2P2002 at [redacted]w[redacted]d was admitted to the hospital 04/21/2023 for induction of labor.  Indication for induction:  high BMI .  Patient had an labor course complicated by protracted second stage; face presentation during laborthat resolved to OA.  Membrane Rupture Time/Date: 6:32 PM,04/21/2023  Delivery Method:Vaginal, Spontaneous Operative Delivery:N/A Episiotomy: Nonenone Lacerations:  noneNone Details of delivery can be found in separate delivery note.  Patient had a postpartum course complicated byNA. Ambulating and voiding without difficulty. Bleeding WNL.  Appetite is good. Pain managed with PRN medications. Breastfeeding independently, also supplementing with formula. Mood is good, has struggles with depression and anxiety in the past.  Her husband and SIL are at the home for [facility] support. Kathy Davenport feels ready to go home. . Patient is discharged home   Newborn Data: Birth date:04/22/2023 Birth time:2:30 AM Gender:Female Living status:Living Apgars:6 ,9  Weight:3520 g  Magnesium Sulfate received: No BMZ received: No Rhophylac:N/A MMR:No T-DaP:Given prenatally Flu: Yes RSV  Vaccine received: No Transfusion:No Immunizations administered: Immunization History  Administered Date(s) Administered   Influenza, Seasonal, Injecte, Preservative Fre 03/26/2023   Influenza,inj,Quad PF,6+ Mos 08/07/2020   Tdap 12/09/2020, 02/04/2023    Physical exam  Vitals:   04/22/23 1930 04/22/23 2344 04/23/23 0300 04/23/23 0810  BP: 138/84 (!) 104/59 126/85 121/84  Pulse: 86 92 94 87  Resp: 16 18 18 18   Temp: 98.6 F (37 C) 97.8 F (36.6 C) 97.6 F (36.4 C) 97.7 F (36.5 C)  TempSrc: Oral Oral Axillary Axillary  SpO2: 97% 99% 97% 99%  Weight:      Height:       General: alert Breasts: no redness or masses, nipples erect and intact bilaterally  Lochia: appropriate Uterine Fundus: firm Incision: N/A Perineum: minimal swelling  DVT Evaluation: No evidence of DVT seen on physical exam. Negative Homan's sign. Trace BLE Labs: Lab Results  Component Value Date   WBC 15.0 ([place]) 04/22/2023   HGB 11.8 (L) 04/22/2023   HCT 35.8 (L) 04/22/2023   MCV 94.5 04/22/2023   PLT 216 04/22/2023      Latest Ref Rng & Units 02/13/2021   11:56 PM  CMP  Glucose 70 - 99 mg/dL 88   BUN 6 - 20 mg/dL 9   Creatinine 2.84 - 1.32 mg/dL 4.40   Sodium 102 - 725 mmol/L 135   Potassium 3.5 - 5.1 mmol/L 4.0   Chloride 98 - 111 mmol/L 104   CO2 22 - 32 mmol/L 21   Calcium 8.9 - 10.3 mg/dL 8.8   Total Protein 6.5 - 8.1  g/dL 6.1   Total Bilirubin 0.3 - 1.2 mg/dL 0.4   Alkaline Phos 38 - 126 U/L 97   AST 15 - 41 U/L 16   ALT 0 - 44 U/L 13    Edinburgh Score:    04/23/2023   10:15 AM  Edinburgh Postnatal Depression Scale Screening Tool  I have been able to laugh and see the funny side of things. 0  I have looked forward with enjoyment to things. 0  I have blamed myself unnecessarily when things went wrong. 0  I have been anxious or worried for no good reason. 0  I have felt scared or panicky for no good reason. 0  Things have been getting on top of me. 1  I have been so unhappy that  I have had difficulty sleeping. 0  I have felt sad or miserable. 1  I have been so unhappy that I have been crying. 1  The thought of harming myself has occurred to me. 0  Edinburgh Postnatal Depression Scale Total 3      After visit meds:  Allergies as of 04/23/2023       Reactions   Penicillins Hives        Medication List     STOP taking these medications    aspirin EC 81 MG tablet   doxylamine (Sleep) 25 MG tablet Commonly known as: UNISOM   pseudoephedrine 30 MG tablet Commonly known as: SUDAFED       TAKE these medications    acetaminophen 325 MG tablet Commonly known as: Tylenol Take 2 tablets (650 mg total) by mouth every 4 (four) hours as needed (for pain scale < 4).   albuterol (2.5 MG/3ML) 0.083% nebulizer solution Commonly known as: PROVENTIL Take 2.5 mg by nebulization every 6 (six) hours as needed for wheezing or shortness of breath.   albuterol 108 (90 Base) MCG/ACT inhaler Commonly known as: VENTOLIN HFA Inhale into the lungs.   benzocaine-Menthol 20-0.5 % Aero Commonly known as: DERMOPLAST Apply 1 Application topically as needed for irritation (perineal discomfort).   dibucaine 1 % Oint Commonly known as: NUPERCAINAL Place 1 Application rectally as needed for hemorrhoids.   EasiVent inhaler Use with inhaler as instructed.   escitalopram 10 MG tablet Commonly known as: Lexapro Take 1 tablet (10 mg total) by mouth daily.   fexofenadine 180 MG tablet Commonly known as: ALLEGRA Take 180 mg by mouth daily.   fexofenadine-pseudoephedrine 60-120 MG 12 hr tablet Commonly known as: ALLEGRA-D Take by mouth.   fluticasone 220 MCG/ACT inhaler Commonly known as: Flovent HFA Inhale 2 puffs into the lungs in the morning and at bedtime.   fluticasone 50 MCG/ACT nasal spray Commonly known as: FLONASE Place into both nostrils daily.   Fusion Plus Caps Take 1 capsule by mouth daily at 6 (six) AM.   hydrOXYzine 25 MG capsule Commonly  known as: Vistaril Take 1 capsule (25 mg total) by mouth 3 (three) times daily as needed.   ibuprofen 600 MG tablet Commonly known as: ADVIL Take 1 tablet (600 mg total) by mouth every 6 (six) hours as needed.   montelukast 10 MG tablet Commonly known as: SINGULAIR TAKE 1 TABLET BY MOUTH EVERYDAY AT BEDTIME   prenatal multivitamin Tabs tablet Take 1 tablet by mouth daily at 12 noon.   witch hazel-glycerin pad Commonly known as: TUCKS Apply 1 Application topically as needed for hemorrhoids.               Discharge Care Instructions  (  From admission, onward)           Start     Ordered   04/23/23 0000  Discharge wound care:       Comments: SHOWER DAILY Wash incision gently with soap and water.  Call office with any drainage, redness, or firmness of the incision.   04/23/23 1427             Discharge home in stable condition Infant Feeding: Bottle and Breast Infant Disposition:home with mother Discharge instruction: per After Visit Summary and Postpartum booklet. Activity: Advance as tolerated. Pelvic rest for 6 weeks.  Diet: routine diet Anticipated Birth Control: Unsure Postpartum Appointment:6 weeks Additional Postpartum F/U: Postpartum Depression checkup Future Appointments:No future appointments. Follow up Visit:  Follow-up Information     Mirna Mires, CNM. Schedule an appointment as soon as possible for a visit in 6 week(s).   Specialties: Obstetrics, Gynecology Why: Make two appointments: first, for a 2 week post delivery virutal vist, and then the second for a 6 week post delivery physical. If you think you might use an IUD orNexplanon, please tell the office scheduler Contact information: [address] [place] [address] [phone]                    Carie Caddy, [place]  04/23/23 2:32 PM

## 2023-04-22 NOTE — Progress Notes (Signed)
Post Partum Day 0 Subjective: Kathy Davenport is feeling well overall. She is ambulating, voiding, and tolerating POs without difficulty. Her pain is well-controlled and her bleeding is WNL. Her mood is stable. Breastfeeding is going well.   Objective: Blood pressure 122/83, pulse 92, temperature 97.7 F (36.5 C), temperature source Oral, resp. rate 20, height 5\' 4"  (1.626 m), weight 104.3 kg, last menstrual period 07/18/2022, SpO2 98%, unknown if currently breastfeeding.  Physical Exam:  General: alert, cooperative, and appears stated age Lochia: appropriate Uterine Fundus: firm Incision: N/A DVT Evaluation: No evidence of DVT seen on physical exam.  Recent Labs    04/21/23 0842  HGB 10.9*  HCT 32.4*    Assessment/Plan: Plan for discharge tomorrow Lactation assistance as needed Routine PP care   LOS: 1 day   Glenetta Borg, CNM 04/22/2023, 12:27 PM

## 2023-04-22 NOTE — Anesthesia Postprocedure Evaluation (Signed)
Anesthesia Post Note  Patient: Kathy Davenport  Procedure(s) Performed: AN AD HOC LABOR EPIDURAL  Patient location during evaluation: Mother Baby Anesthesia Type: Epidural Level of consciousness: awake and alert Pain management: pain level controlled Vital Signs Assessment: post-procedure vital signs reviewed and stable Respiratory status: spontaneous breathing, nonlabored ventilation and respiratory function stable Cardiovascular status: stable Postop Assessment: no headache, no backache and epidural receding Anesthetic complications: no   No notable events documented.   Last Vitals:  Vitals:   04/22/23 0500 04/22/23 0636  BP: 132/83 112/69  Pulse: 86 89  Resp: 18 20  Temp: 37 C 36.7 C  SpO2: 97% 98%    Last Pain:  Vitals:   04/22/23 0636  TempSrc: Oral  PainSc:                  Jeanine Luz

## 2023-04-22 NOTE — Telephone Encounter (Signed)
[facility] called to give results of patient ultrasound done on 04/12/2023. I told her that she delivered today. She said she was late getting report back.

## 2023-04-22 NOTE — Progress Notes (Signed)
Kathy Davenport is a 32 y.o. G2P1001 who continues with induction of labor. After reaching complete dilation, the vertex was determined to be no longer a face presentation, but OA.With her baby still at -1 station, she pushed for a period of time. The fetal heart tracing demonstrated variables and decels with contractions and pushing. A decision was made to have her rest for several hours to allow the baby to descend and then restart the pushing process.   Subjective: Her epidural is working very well for her and she has begun pushing again, demonstrating good effort.  Objective: BP 127/82   Pulse 93   Temp 98 F (36.7 C) (Oral)   Resp 16   Ht 5\' 4"  (1.626 m)   Wt 104.3 kg   LMP 07/18/2022   SpO2 99%   BMI 39.48 kg/m  I/O last 3 completed shifts: In: 1282.9 [I.V.:1266.8; Other:16.1] Out: 450 [Urine:450] Total I/O In: -  Out: 225 [Urine:225]  FHT:  FHR: 150-155 bpm, variability: moderate,  accelerations:  Present,  decelerations:  Present variables noted  with pushing and contractions. UC:   regular, every 2-3 minutes SVE:   Dilation: 10 Effacement (%): 100 Station: 0 Exam by:: [place], SNM  Labs: Lab Results  Component Value Date   WBC 9.0 04/21/2023   HGB 10.9 (L) 04/21/2023   HCT 32.4 (L) 04/21/2023   MCV 93.9 04/21/2023   PLT 190 04/21/2023    Assessment / Plan: Second stage labor- complete for several hours-  Variable decels noted with pushing at times. Will attempt another period of pushing. Dr. Valentino Saxon has been updated regarding FHT tracing and the use of low dose pitocin to support contractions.  Fetal Wellbeing:  Category II Pain Control:  Epidural I/D:  n/a Anticipated MOD:   Have discussed with patient that a period time ofor pushing will now start, but should her baby's tracing indicate fetal intolerance of labor, will need to change plan of care for delivery. Recommendation for Cesarean section would then be indicated. The patient has verblized  understanding. Mirna Mires, CNM 04/22/2023, 1:19 AM

## 2023-04-23 MED ORDER — IBUPROFEN 600 MG PO TABS: 600.0000 mg | ORAL_TABLET | Freq: Four times a day (QID) | ORAL | Status: AC | PRN

## 2023-04-23 MED ORDER — WITCH HAZEL-GLYCERIN EX PADS: 1.0000 | MEDICATED_PAD | CUTANEOUS | 12 refills | Status: DC | PRN

## 2023-04-23 MED ORDER — BENZOCAINE-MENTHOL 20-0.5 % EX AERO: 1.0000 | INHALATION_SPRAY | CUTANEOUS | Status: DC | PRN

## 2023-04-23 MED ORDER — DIBUCAINE (PERIANAL) 1 % EX OINT: 1.0000 | TOPICAL_OINTMENT | CUTANEOUS | Status: DC | PRN

## 2023-04-23 NOTE — Discharge Instructions (Signed)
Discharge instructions Bleeding: Your bleeding could continue up to 6 weeks, the flow should gradually decrease and the color should become dark then lightened over the next couple of weeks. If you notice you are bleeding heavily or passing clots larger than the size of your fist, PLEASE call your physician. No TAMPONS, DOUCHING, ENEMAS OR SEXUAL INTERCOURSE for 6 weeks. Stitches: Shower daily with mild soap and water. Stitches will dissolve over the next couple of weeks, if you experience any discomfort in the vaginal area you may sit in warm water 15-20 minutes, 3-4 times per day. Just enough water to cover vaginal area. AfterPains: This is the uterus contracting back to its normal position and size. Use medications prescribed or recommended by your physician to help relieve this discomfort. Bowels/Hemorrhoids: Drink plenty of water and stay active. Increase fiber, fresh fruits and vegetables in your diet. Rest/Activity: Rest when the baby is resting;  Do not lift > 10 lbs for 6 weeks. No driving for 1-2 weeks. Bathing: Shower daily! Diet: Continue daily prenatal vitamin and iron until your follow up visit to help replenish nutrients and vitamins. If breastfeeding eat extra calories and increase your fluid intake to 12 glasses a day. Contraception: Consult with your provider on what method of birth control you would like to use. Breastfeeding: You may have a slight fever when your milk comes in, but it should go away on its own. If it does not, and rises above 101.0 please call the doctor. Bottlefeeding: wear a snug fitting bra without underwires continuously for 3-5days, avoid any nipple/breast stimulation. If engorgement occurs, take ibuprofen as prescribed and apply fresh green cabbage leaves directly to your breasts inside the bra cups. Postpartum "BLUES": It is common to emotional days after delivery, however if it persist for greater than 2 weeks or if you feel concerned please let your physician  know immediately. This is hormone driven and nothing you can control so please let someone know how you feel. Follow Up Visit: Please schedule a follow up visit with your delivering provider  Call office if you have any of the following: headache, visual changes, fever >101.0 F, chills, breast concerns, excessive vaginal bleeding, incision drainage or problems, leg pain or redness, depression or any other concerns.  For concerns about your baby, please call your pediatrician For breastfeeding concerns, the lactation consultant can be reached at [phone]

## 2023-04-23 NOTE — Progress Notes (Signed)
Patient discharged. Discharge instructions given. Patient verbalizes understanding. Transported by axillary. 

## 2023-04-23 NOTE — Anesthesia Postprocedure Evaluation (Signed)
Anesthesia Post Note  Patient: Kathy Davenport  Procedure(s) Performed: AN AD HOC LABOR EPIDURAL  Patient location during evaluation: Mother Baby Anesthesia Type: Epidural Level of consciousness: awake and alert and oriented Pain management: pain level controlled Vital Signs Assessment: post-procedure vital signs reviewed and stable Respiratory status: respiratory function stable Cardiovascular status: stable Postop Assessment: no headache, no backache, patient able to bend at knees, able to ambulate, adequate PO intake and no apparent nausea or vomiting Anesthetic complications: no   No notable events documented.   Last Vitals:  Vitals:   04/23/23 0300 04/23/23 0810  BP: 126/85 121/84  Pulse: 94 87  Resp: 18 18  Temp: 36.4 C 36.5 C  SpO2: 97% 99%    Last Pain:  Vitals:   04/23/23 0912  TempSrc:   PainSc: 3                  Zachary George

## 2023-04-23 NOTE — Lactation Note (Signed)
This note was copied from a baby's chart. Lactation Consultation Note  Patient Name: Kathy Davenport [id number] Date: 04/23/2023 Age:34 hours Reason for consult: Follow-up assessment;Term   Maternal Data Patient verbalized to [facility] that last night was rough and she gave infant a bottle of formula.  Infant is still going to the breast and those feedings have been good.   Feeding Mother's Current Feeding Choice: Breast Milk and Formula  Interventions Interventions: Education;CDC Guidelines for Breast Pump Cleaning  LC praised patient for everything she has been doing.  Reminded mom about feeding on demand.  LC provided mom a breastmilk storage guidelines handout and reviewed this with mom. LC pointed out from the white board in the room how to contact [facility] lactation office for an outpatient appointment or just how to call for lactation questions. Patient verbalized understanding.  Discharge Discharge Education: Outpatient recommendation  Consult Status Consult Status: Complete Follow-up type: Call as needed    Kathy Davenport 04/23/2023, 9:30 AM

## 2023-04-23 NOTE — Addendum Note (Signed)
Addendum  created 04/23/23 1142 by Omer Jack, CRNA   Clinical Note Signed

## 2023-05-05 ENCOUNTER — Encounter: Payer: Self-pay | Admitting: Obstetrics

## 2023-05-05 ENCOUNTER — Telehealth (INDEPENDENT_AMBULATORY_CARE_PROVIDER_SITE_OTHER): Payer: 59 | Admitting: Obstetrics

## 2023-05-05 NOTE — Progress Notes (Signed)
Postpartum Visit  Chief Complaint:  Chief Complaint  Patient presents with   Postpartum Care    2 week PP    History of Present Illness: Patient is a 32 y.o. [id number] presents for a virtual postpartum visit.she was identified by name and DOB. On the call were Meridee Score and Paula Compton CNM  Date of delivery: 04/22/2023 Type of delivery: Vaginal delivery - Vacuum or forceps assisted  no Episiotomy No.  Laceration: no  Pregnancy or labor problems:  no Any problems since the delivery:  no  Newborn Details:  SINGLETON :  1. Baby's name: girl. Birth weight: 3520g Maternal Details:  Breast Feeding:  yes Post partum depression/anxiety noted:  no Edinburgh Post-Partum Depression Score:  1    Past Medical History:  Diagnosis Date   ADHD (attention deficit hyperactivity disorder)    ADHD (attention deficit hyperactivity disorder)    Was on adderall at beginning of pregnancy, stopped when found out she was pregnant   Anxiety    Anxiety    Was on ativan at beginning of pregnancy, tapered and now completely off     10/30/2020  -pt reports 2-3 panic attacks per week, reporting intense fear, heart racing, withdrawing from people around her, shortness of breath  -pt has used hydroxyzine in the past with success  -Atarax RX given for PRN use  -pt currently seeing therapist weekly, not connected with psychiatrist d/t insurance change  -re   Asthma    Depression    Depression 08/07/2020   On lexapro, was on ativan prior to pregnancy, tapered when she found out  Doing well now, recommend stay on lexapro for now   Hypertension    Labor and delivery, indication for care 02/21/2023   Major depressive disorder, recurrent, 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 Authorizing Provider   benzocaine-Menthol (DERMOPLAST) 20-0.5 % AERO Apply 1 Application topically as needed for irritation (perineal discomfort). 04/23/23  Yes Dominic, Courtney Heys, CNM  dibucaine (NUPERCAINAL) 1 % OINT Place 1 Application rectally as needed for hemorrhoids. 04/23/23  Yes Dominic, Courtney Heys, CNM  escitalopram (LEXAPRO) 10 MG tablet Take 1 tablet (10 mg total) by mouth daily. 03/18/23  Yes Swanson, Adan Sis, CNM  fexofenadine (ALLEGRA) 180 MG tablet Take 180 mg by mouth daily.   Yes [provider]  fluticasone (FLONASE) 50 MCG/ACT nasal spray Place into both nostrils daily.   Yes [provider]  fluticasone (FLOVENT HFA) 220 MCG/ACT inhaler Inhale 2 puffs into the lungs in the morning and at bedtime. 09/10/22  Yes Anyanwu, Jethro Bastos, MD  hydrOXYzine (VISTARIL) 25 MG capsule Take 1 capsule (25 mg total) by mouth 3 (three) times daily as needed. 01/14/23  Yes Free, Sarah J, CNM  ibuprofen (ADVIL) 600 MG tablet Take 1 tablet (600 mg total) by mouth every 6 (six) hours as needed. 04/23/23  Yes Dominic, Courtney Heys, CNM  montelukast (SINGULAIR) 10 MG tablet TAKE 1 TABLET BY MOUTH EVERYDAY AT BEDTIME 10/05/22  Yes Anyanwu, Jethro Bastos, MD  Prenatal Vit-Fe Fumarate-FA (PRENATAL MULTIVITAMIN) TABS tablet Take 1 tablet by mouth daily at 12 noon.   Yes [provider]  Spacer/Aero-Holding Chambers (EASIVENT) inhaler Use with inhaler as instructed. 08/10/22  Yes [provider]  witch hazel-glycerin (TUCKS)  pad Apply 1 Application topically as needed for hemorrhoids. 04/23/23  Yes Dominic, Courtney Heys, CNM  albuterol (VENTOLIN HFA) 108 (90 Base) MCG/ACT inhaler Inhale into the lungs. 08/10/22 09/22/22  [provider]    Allergies  Allergen Reactions   Penicillins Hives     Social History   Socioeconomic History   Marital status: Married    Spouse name: Not on file   Number of children: Not on file   Years of education: Not on file   Highest education level: Not on  file  Occupational History   Not on file  Tobacco Use   Smoking status: Former    Types: E-cigarettes   Smokeless tobacco: Never   Tobacco comments:    vape  Vaping Use   Vaping status: Former   Substances: Nicotine  Substance and Sexual Activity   Alcohol use: Not Currently    Comment: occasionally, prior to pregnancy   Drug use: Not Currently    Types: Marijuana    Comment: not since early Jan   Sexual activity: Not Currently    Partners: Male    Birth control/protection: None  Other Topics Concern   Not on file  Social History Narrative   Not on file   Social Determinants of Health   Financial Resource Strain: Not at Risk (09/16/2022)   Received from [place], [employer]    Financial Resource Strain: 1  Food Insecurity: No Food Insecurity (04/21/2023)   Hunger Vital Sign    Worried About Running Out of Food in the Last Year: Never true    Ran Out of Food in the Last Year: Never true  Transportation Needs: No Transportation Needs (04/21/2023)   PRAPARE - Administrator, Civil Service (Medical): No    Lack of Transportation (Non-Medical): No  Physical Activity: Not on File (08/26/2022)   Received from [place], [place]   Physical Activity    Physical Activity: 0  Stress: Not on File (08/26/2022)   Received from [facility], [place]   Stress    Stress: 0  Social Connections: Not on File (03/02/2023)   Received from [facility]   Social Connections    Connectedness: 0  Intimate Partner Violence: Not on file    Family History  Problem Relation Age of Onset   Stroke Paternal Grandfather    Prostate cancer Paternal Grandfather    Stroke Paternal Grandmother    Fibromyalgia Maternal Grandmother    Pulmonary embolism Maternal Grandfather    Thyroid nodules Father    Hyperlipidemia Mother        Physical Exam Breastfeeding Yes        Assessment: 32 y.o. [id number] presenting for 2 week virtual postpartum visit Doing well. No PP depressive  symptoms  Plan: Problem List Items Addressed This Visit       Other   Postpartum care following vaginal delivery - Primary     1) Contraception Education given regarding options for contraception, including IUD placement, oral contraceptives.  3) Patient underwent screening for PP depression  with no concerns. She will RTC in 4 wks  for her physical and she may get an IUD.  Mirna Mires, CNM  05/05/2023 1:43 PM   05/05/2023 1:38 PM

## 2023-06-04 ENCOUNTER — Ambulatory Visit (INDEPENDENT_AMBULATORY_CARE_PROVIDER_SITE_OTHER): Payer: 59 | Admitting: Obstetrics

## 2023-06-04 ENCOUNTER — Encounter: Payer: Self-pay | Admitting: Obstetrics

## 2023-06-04 DIAGNOSIS — Z30014 Encounter for initial prescription of intrauterine contraceptive device: Secondary | ICD-10-CM

## 2023-06-04 DIAGNOSIS — Z30430 Encounter for insertion of intrauterine contraceptive device: Secondary | ICD-10-CM

## 2023-06-04 DIAGNOSIS — Z1332 Encounter for screening for maternal depression: Secondary | ICD-10-CM

## 2023-06-04 MED ORDER — LEVONORGESTREL 20 MCG/DAY IU IUD
1.0000 | INTRAUTERINE_SYSTEM | Freq: Once | INTRAUTERINE | Status: AC
  Administered 2023-06-04: 1 via INTRAUTERINE

## 2023-06-04 NOTE — Patient Instructions (Signed)
Intrauterine Device Insertion An intrauterine device (IUD) is a medical device that is inserted into the uterus to prevent pregnancy. It is a small, T-shaped device that has one or two nylon strings hanging down from it. The strings hang out of the lower part of the uterus (cervix) to allow for future IUD removal. There are two types of IUDs: Hormone IUD. This type of IUD is made of plastic and contains the hormone progestin (synthetic progesterone). A hormone IUD may last 3-5 years, depending on which one you have. Synthetic progesterone prevents pregnancy by: Thickening cervical mucus to prevent sperm from entering the uterus. Thinning the uterine lining to prevent a fertilized egg from implanting there. Copper IUD. This type of IUD has copper wire wrapped around it. A copper IUD may last up to 10 years. Copper prevents pregnancy by making the uterus and fallopian tubes produce a fluid that kills sperm. Tell a health care provider about: Any allergies you have. All medicines you are taking, including vitamins, herbs, eye drops, creams, and over-the-counter medicines. Any surgeries you have had. Any medical conditions you have, including any sexually transmitted infections (STIs) you may have. Whether you are pregnant or may be pregnant. What are the risks? Generally, this is a safe procedure. However, problems may occur, including: Infection. Bleeding. Allergic reactions to medicines. Puncture (perforation) of the uterus or damage to other structures or organs. Accidental placement of the IUD either in the muscle layer of the uterus (myometrium) or outside the uterus. The IUD falling out of the uterus (expulsion). This is more common among women who have recently had a child. Higher risk of an egg being fertilized outside your uterus (ectopic pregnancy).This is rare. Pelvic inflammatory disease (PID), which is an infection in the uterus and fallopian tubes. The IUD does not cause the  infection. The infection is usually from an unknown sexually transmitted infection (STI). This is rare, and it usually happens during the first 20 days after the IUD is inserted. What happens before the procedure? Ask your health care provider about: Changing or stopping your regular medicines. This is especially important if you are taking diabetes medicines or blood thinners. Taking over-the-counter medicines, vitamins, herbs, and supplements. Talk with your health care provider about when to schedule your IUD placement. Your health care provider may recommend taking over-the-counter pain medicines before the procedure. These medicines include ibuprofen and naproxen. You may have tests for: Pregnancy. A pregnancy test involves having a urine or blood sample taken. Sexually transmitted infections (STIs). Placing an IUD in someone who has an STI can make the infection worse. Cervical cancer. You may have a Pap test to check for this type of cancer. This means collecting cells from your cervix to be checked under a microscope. You may have a physical exam to determine the size and position of your uterus. What happens during the procedure? A tool (speculum) will be placed in your vagina and widened so that your health care provider can see your cervix. Medicine, or antiseptic, may be applied to your cervix to help lower your risk of infection. You may be given an anesthetic medicine to numb each side of your cervix. This medicine is usually given by an injection into the cervix. A tool called a uterine sound will be inserted into your uterus to check the length of your uterus and the direction that your uterus may be tilted. A slim instrument or tube (IUD inserter) that holds the IUD will be inserted into your vagina,  through your cervical canal, and into your uterus. The IUD will be placed in the uterus, and the IUD inserter will be removed. The strings that are attached to the IUD will be trimmed  so that they lie just below the cervix. The speculum will be removed. The procedure may vary among health care providers and hospitals. What can I expect after procedure? You may have bleeding after the procedure. This is normal. It varies from light bleeding (spotting) for a few days to menstrual-like bleeding. You may have cramping and pain in the abdomen. You may feel dizzy or light-headed. You may have lower back pain. You may have headaches and nausea. Follow these instructions at home: Before resuming sexual activity, check to make sure that you can feel the IUD string or strings. You should be able to feel the end of the string below the opening of your cervix. If your IUD string is in place, you may resume sexual activity. If you had a hormonal IUD inserted more than 7 days after your most recent period started, you will need to use a backup method of birth control for 7 days after IUD insertion. Ask your health care provider whether this applies to you. Continue to check that the IUD is still in place by feeling for the strings after every menstrual period, or once a month. An IUD will not protect you from sexually transmitted infections (STIs). Use methods to prevent the exchange of body fluids between partners (barrier protection) every time you have sex. Barrier protection can be used during oral, vaginal, or anal sex. Commonly used barrier methods include: Female condom. Female condom. Dental dam. Take over-the-counter and prescription medicines only as told by your health care provider. Keep all follow-up visits. This is important. Contact a health care provider if: You feel light-headed or weak. You have any of the following problems with your IUD string or strings: The string bothers or hurts you or your sexual partner. You cannot feel the string. The string has gotten longer. You can feel the IUD in your vagina. You think you may be pregnant, or you miss your menstrual  period. You think you may have a sexually transmitted infection (STI). Get help right away if you: You have flu-like symptoms, such as tiredness (fatigue) and muscle aches. You have a fever and chills. You have bleeding that is heavier or lasts longer than a normal menstrual cycle. You have abnormal or bad-smelling discharge from your vagina. You develop abdominal pain that is new, is getting worse, or is not in the same area of earlier cramping and pain. You have pain during sexual activity. Summary An intrauterine device (IUD) is a small, T-shaped device that has one or two nylon strings hanging down from it. You may have a copper IUD or a hormone IUD. Ask your health care provider what you need to do before the procedure. You may have some tests and you may have to change or stop some medicines. You may have bleeding after the procedure. This is normal. It varies from light spotting for a few days to menstrual-like bleeding. Check to make sure that you can feel the IUD strings before you resume sexual activity. Check the strings after every menstrual period or once a month. An IUD does not protect against STIs. Use other methods to protect yourself against infections. This information is not intended to replace advice given to you by your health care provider. Make sure you discuss any questions you have with  your health care provider. Document Revised: 12/27/2019 Document Reviewed: 12/27/2019 Elsevier Patient Education  2024 [employer].

## 2023-06-04 NOTE — Progress Notes (Signed)
   OBSTETRICS POSTPARTUM CLINIC PROGRESS NOTE  Subjective:     Kathy Davenport is a 32 y.o. [id number] female who presents for a postpartum visit. She is 6 weeks postpartum following a spontaneous vaginal delivery. I have fully reviewed the prenatal and intrapartum course. The delivery was at 39 gestational weeks.  Anesthesia: epidural. Postpartum course has been without problems. Baby's course has been WNL. Baby is feeding by breast. Bleeding: patient has not not resumed menses, with No LMP recorded.. Bowel function is normal. Bladder function is normal. Patient is not sexually active. Contraception method desired is IUD. Postpartum depression screening: negative.  EDPS score is 6.    The following portions of the patient's history were reviewed and updated as appropriate: allergies, current medications, past family history, past medical history, past social history, past surgical history, and problem list.  Review of Systems Pertinent items are noted in HPI.   Objective:    BP 103/78   Pulse 84   Ht 5\' 4"  (1.626 m)   Wt 226 lb (102.5 kg)   Breastfeeding Yes   BMI 38.79 kg/m   General:  alert and no distress   Breasts:  inspection negative, no nipple discharge or bleeding, no masses or nodularity palpable  Lungs: clear to auscultation bilaterally  Heart:  regular rate and rhythm, S1, S2 normal, no murmur, click, rub or gallop  Abdomen: soft, non-tender; bowel sounds normal; no masses,  no organomegaly.     Vulva:  normal  Vagina: normal vagina, no discharge, exudate, lesion, or erythema  Cervix:  no cervical motion tenderness and no lesions  Corpus: normal size, anteverted, well involuted,contour, position, consistency, mobility, non-tender  Adnexa:  normal adnexa and no mass, fullness, tenderness  Rectal Exam: Not performed.         Labs:  Lab Results  Component Value Date   HGB 11.8 (L) 04/22/2023     Assessment:   1. Postpartum care following vaginal delivery   2.  Encounter for screening for maternal depression   3. Encounter for initial prescription of intrauterine contraceptive device (IUD)   4. Encounter for insertion of Mirena IUD      Plan:    1. Contraception: IUD. She is having a Mirena placed today. 2. Will check Hgb for h/o postpartum anemia of less than 10.  3. Follow up in: 4 weeks  for an IUD string checkor as needed.    UD Insertion Procedure Note Patient identified, informed consent performed, consent signed.   Discussed risks of irregular bleeding, cramping, infection, malpositioning, expulsion or uterine perforation of the IUD (1:1000 placements)  which may require further procedure such as laparoscopy.  IUD while effective at preventing pregnancy do not prevent transmission of sexually transmitted diseases and use of barrier methods for this purpose was discussed. Time out was performed.  Urine pregnancy test negative.  Speculum placed in the vagina.  Cervix visualized.  Cleaned with Betadine x 2.  Grasped anteriorly with a single tooth tenaculum.  Uterus sounded to 7.5 cm. IUD placed per manufacturer's recommendations.  Strings trimmed to 3 cm. Tenaculum was removed, good hemostasis noted.  Patient tolerated procedure well.   Patient was given post-procedure instructions.  She was advised to have backup contraception for one week.  Patient was also asked to check IUD strings periodically and follow up in 4 weeks for IUD check.    Mirna Mires, CNM [facility]

## 2023-07-02 ENCOUNTER — Ambulatory Visit (INDEPENDENT_AMBULATORY_CARE_PROVIDER_SITE_OTHER): Payer: 59 | Admitting: Licensed Practical Nurse

## 2023-07-02 VITALS — BP 127/84 | HR 89 | Wt 231.9 lb

## 2023-07-02 DIAGNOSIS — Z30431 Encounter for routine checking of intrauterine contraceptive device: Secondary | ICD-10-CM

## 2023-07-02 NOTE — Progress Notes (Signed)
    GYNECOLOGY OFFICE ENCOUNTER NOTE  History:  33 y.o. [id number] here today for today for IUD string check; Mirena   IUD was placed  06/04/2023. No complaints about the IUD, no concerning side effects. She has had light spotting since insertion. She thinks she can feel her strings. She has not yet had IC.   The following portions of the patient's history were reviewed and updated as appropriate: allergies, current medications, past family history, past medical history, past social history, past surgical history and problem list. Last pap smear on 10/2022 was normal, negative HRHPV.  Review of Systems:  Pertinent items are noted in HPI.  Objective:  Physical Exam Blood pressure 127/84, pulse 89, weight 231 lb 14.4 oz (105.2 kg), currently breastfeeding. CONSTITUTIONAL: Well-developed, well-nourished female in no acute distress.  NEUROLOGIC: Alert and oriented to person, place, and time. Normal reflexes, muscle tone coordination.  ABDOMEN: Soft, no distention noted.   PELVIC: Normal appearing external genitalia; normal appearing vaginal mucosa and cervix.  IUD strings visualized, about 2 cm in length outside cervix. Done in the presence of a chaperone.  EXTREMITIES: Non-tender, no edema or cyanosis  Assessment & Plan:  Patient to keep IUD in place for up to 8 years; can come in for removal if she desires pregnancy earlier or for any concerning side effects.    Elzie Knisley, Jinnie Jansky, CNM

## 2023-11-16 ENCOUNTER — Ambulatory Visit: Payer: 59 | Admitting: Family

## 2023-11-16 ENCOUNTER — Ambulatory Visit: Admitting: General Practice
